# Patient Record
Sex: Male | Born: 1960 | Race: Black or African American | Hispanic: No | Marital: Married | Smoking: Former smoker
Health system: Southern US, Community
[De-identification: ages and names within clinical notes are randomized; demographics above are authoritative.]

## PROBLEM LIST (undated history)

## (undated) DIAGNOSIS — Z789 Other specified health status: Secondary | ICD-10-CM

## (undated) DIAGNOSIS — E119 Type 2 diabetes mellitus without complications: Secondary | ICD-10-CM

## (undated) HISTORY — PX: HERNIA REPAIR: SHX51

## (undated) HISTORY — PX: NO PAST SURGERIES: SHX2092

---

## 2004-11-05 ENCOUNTER — Ambulatory Visit: Payer: Self-pay | Admitting: Family Medicine

## 2004-11-06 ENCOUNTER — Ambulatory Visit (HOSPITAL_COMMUNITY): Admission: RE | Admit: 2004-11-06 | Discharge: 2004-11-06 | Payer: Self-pay | Admitting: Family Medicine

## 2004-11-19 ENCOUNTER — Ambulatory Visit: Payer: Self-pay | Admitting: Family Medicine

## 2004-11-21 ENCOUNTER — Ambulatory Visit (HOSPITAL_COMMUNITY): Admission: RE | Admit: 2004-11-21 | Discharge: 2004-11-21 | Payer: Self-pay | Admitting: Family Medicine

## 2007-12-27 ENCOUNTER — Emergency Department (HOSPITAL_COMMUNITY): Admission: EM | Admit: 2007-12-27 | Discharge: 2007-12-27 | Payer: Self-pay | Admitting: Emergency Medicine

## 2012-01-18 ENCOUNTER — Encounter (HOSPITAL_COMMUNITY): Payer: Self-pay | Admitting: *Deleted

## 2012-01-18 ENCOUNTER — Emergency Department (HOSPITAL_COMMUNITY)
Admission: EM | Admit: 2012-01-18 | Discharge: 2012-01-18 | Disposition: A | Discharge status: Home / Self Care | Payer: Self-pay | Attending: Emergency Medicine | Admitting: Emergency Medicine

## 2012-01-18 DIAGNOSIS — R21 Rash and other nonspecific skin eruption: Secondary | ICD-10-CM | POA: Insufficient documentation

## 2012-01-18 DIAGNOSIS — L259 Unspecified contact dermatitis, unspecified cause: Secondary | ICD-10-CM

## 2012-01-18 DIAGNOSIS — F17200 Nicotine dependence, unspecified, uncomplicated: Secondary | ICD-10-CM | POA: Insufficient documentation

## 2012-01-18 MED ORDER — DEXAMETHASONE SODIUM PHOSPHATE 10 MG/ML IJ SOLN
10.0000 mg | Freq: Once | INTRAMUSCULAR | Status: AC
  Administered 2012-01-18: 10 mg via INTRAMUSCULAR
  Filled 2012-01-18: qty 1

## 2012-01-18 MED ORDER — PREDNISONE 50 MG PO TABS: 50.0000 mg | ORAL_TABLET | Freq: Every day | ORAL | Status: AC

## 2012-01-18 NOTE — ED Provider Notes (Signed)
History     CSN: [id number]  Arrival date & time 01/18/12  7829   First MD Initiated Contact with Patient 01/18/12 1010      Chief Complaint  Patient presents with  . Rash    (Consider location/radiation/quality/duration/timing/severity/associated sxs/prior treatment) HPI Patient presents with four-day history of a rash to the upper arms, neck and hands.  Patient, states he did use a new moisturizing lotion and that that may be the cause.  Patient denies chest pain, shortness breath, wheezing, difficulty breathing, throat swelling, nausea, vomiting, or diarrhea.  Patient, states he did not try name treatment prior to arrival, for his symptoms. History reviewed. No pertinent past medical history.  History reviewed. No pertinent past surgical history.  History reviewed. No pertinent family history.  History  Substance Use Topics  . Smoking status: Current Everyday Smoker  . Smokeless tobacco: Not on file  . Alcohol Use: No      Review of Systems All other systems negative except as documented in the HPI. All pertinent positives and negatives as reviewed in the HPI.  Allergies  Review of patient's allergies indicates no known allergies.  Home Medications   Current Outpatient Rx  Name Route Sig Dispense Refill  . ADULT MULTIVITAMIN W/MINERALS CH Oral Take 1 tablet by mouth daily. ALIVE Chewable 50 plus      BP 141/93  Pulse 70  Temp 98 F (36.7 C) (Oral)  Resp 17  SpO2 100%  Physical Exam  Nursing note and vitals reviewed. Constitutional: He appears well-developed and well-nourished.  Cardiovascular: Normal rate, regular rhythm and normal heart sounds.  Exam reveals no gallop and no friction rub.   No murmur heard. Pulmonary/Chest: Effort normal and breath sounds normal. No respiratory distress.  Skin: Skin is warm and dry. Rash noted. Rash is maculopapular.       ED Course  Procedures (including critical care time)   The patient will be treated for  contact type reaction. Told to return here as needed. Benadryl for itching MDM          TAYDON NASWORTHY, PA-C 01/18/12 1114

## 2012-01-18 NOTE — ED Notes (Signed)
C/o rash & itchiness x 5 days. Rash noted to both axillary areas, bilateral forearms, neck, lower back & perineal area. No drainage. Reports has had it before in past. Denies any new meds, soap, detergent.

## 2012-01-18 NOTE — ED Notes (Signed)
Pt reports rash to abdomen, back, arms, and privates x 4 days. Pt reports he also has blister to left foot. Airway intact. Pt has been using alcohol to skin to help relieve itching. Small macular rash noted.

## 2012-01-19 NOTE — ED Provider Notes (Signed)
Medical screening examination/treatment/procedure(s) were conducted as a shared visit with non-physician practitioner(s) and myself.  I personally evaluated the patient during the encounter  Toy Baker, MD 01/19/12 0830

## 2012-02-23 ENCOUNTER — Emergency Department (HOSPITAL_COMMUNITY)
Admission: EM | Admit: 2012-02-23 | Discharge: 2012-02-23 | Disposition: A | Discharge status: Home / Self Care | Payer: Self-pay | Attending: Emergency Medicine | Admitting: Emergency Medicine

## 2012-02-23 ENCOUNTER — Encounter (HOSPITAL_COMMUNITY): Payer: Self-pay

## 2012-02-23 DIAGNOSIS — M7989 Other specified soft tissue disorders: Secondary | ICD-10-CM

## 2012-02-23 DIAGNOSIS — M79605 Pain in left leg: Secondary | ICD-10-CM

## 2012-02-23 DIAGNOSIS — L02419 Cutaneous abscess of limb, unspecified: Secondary | ICD-10-CM | POA: Insufficient documentation

## 2012-02-23 DIAGNOSIS — M79609 Pain in unspecified limb: Secondary | ICD-10-CM

## 2012-02-23 DIAGNOSIS — M79672 Pain in left foot: Secondary | ICD-10-CM

## 2012-02-23 DIAGNOSIS — L0390 Cellulitis, unspecified: Secondary | ICD-10-CM

## 2012-02-23 DIAGNOSIS — F17200 Nicotine dependence, unspecified, uncomplicated: Secondary | ICD-10-CM | POA: Insufficient documentation

## 2012-02-23 LAB — CBC WITH DIFFERENTIAL/PLATELET
Basophils Absolute: 0 10*3/uL (ref 0.0–0.1)
Hemoglobin: 13.9 g/dL (ref 13.0–17.0)
MCH: 32 pg (ref 26.0–34.0)
MCHC: 34.5 g/dL (ref 30.0–36.0)
MCV: 92.9 fL (ref 78.0–100.0)
Platelets: 217 10*3/uL (ref 150–400)
RBC: 4.34 MIL/uL (ref 4.22–5.81)

## 2012-02-23 LAB — BASIC METABOLIC PANEL
Calcium: 9.4 mg/dL (ref 8.4–10.5)
Creatinine, Ser: 0.98 mg/dL (ref 0.50–1.35)
GFR calc Af Amer: >90 mL/min (ref >90)
Glucose, Bld: 130 mg/dL — ABNORMAL HIGH (ref 70–99)
Sodium: 136 mEq/L (ref 135–145)

## 2012-02-23 MED ORDER — OXYCODONE-ACETAMINOPHEN 5-325 MG PO TABS
2.0000 | ORAL_TABLET | Freq: Once | ORAL | Status: AC
  Administered 2012-02-23: 2 via ORAL
  Filled 2012-02-23: qty 2

## 2012-02-23 MED ORDER — OXYCODONE-ACETAMINOPHEN 5-325 MG PO TABS: 1.0000 | ORAL_TABLET | Freq: Four times a day (QID) | ORAL | Status: DC | PRN

## 2012-02-23 MED ORDER — CEPHALEXIN 500 MG PO CAPS: 500.0000 mg | ORAL_CAPSULE | Freq: Four times a day (QID) | ORAL | Status: DC

## 2012-02-23 MED ORDER — CEFAZOLIN SODIUM 1-5 GM-% IV SOLN
1.0000 g | Freq: Once | INTRAVENOUS | Status: AC
  Administered 2012-02-23: 1 g via INTRAVENOUS
  Filled 2012-02-23: qty 50

## 2012-02-23 NOTE — ED Provider Notes (Signed)
History     CSN: [id number]  Arrival date & time 02/23/12  1017   First MD Initiated Contact with Patient 02/23/12 1036      Chief Complaint  Patient presents with  . Leg Pain    left side  . Leg Swelling    left side    (Consider location/radiation/quality/duration/timing/severity/associated sxs/prior treatment) HPI Comments: Frank Landry 51 y.o. male   The chief complaint is: Patient presents with:   Leg Pain - left side   Leg Swelling - left side   The patient has medical history significant for:   History reviewed. No pertinent past medical history.  Patient presents with left leg swelling and foot pain x 2 weeks. He states that the pain 10/10 and has affected his ability to ambulate. Patient states he has noticed that the foot has been weeping and is treating it with hydrogen peroxide. He denies recent long travel. History of blood clot, hypercoaguable state, cancer, or trauma to this leg. The patient has not seen a physician in many years so current medical problems if any are unknown. Denies fever or chills but reports diaphoresis. Denies CP or SOB. Denies NVD or abdominal pain.      The history is provided by the patient.    History reviewed. No pertinent past medical history.  Past Surgical History  Procedure Date  . No past surgeries     No family history on file.  History  Substance Use Topics  . Smoking status: Current Every Day Smoker  . Smokeless tobacco: Not on file  . Alcohol Use: Yes     occasionally      Review of Systems  Constitutional: Negative for fever and diaphoresis.  Respiratory: Negative for shortness of breath.   Cardiovascular: Positive for leg swelling. Negative for chest pain and palpitations.  Gastrointestinal: Negative for nausea, vomiting, abdominal pain and diarrhea.  Skin: Positive for color change.  All other systems reviewed and are negative.    Allergies  Review of patient's allergies indicates no  known allergies.  Home Medications   Current Outpatient Rx  Name Route Sig Dispense Refill  . ADULT MULTIVITAMIN W/MINERALS CH Oral Take 1 tablet by mouth daily. ALIVE Chewable 50 plus      BP 137/89  Pulse 90  Temp 98.2 F (36.8 C) (Oral)  Resp 16  SpO2 96%  Physical Exam  Nursing note and vitals reviewed. Constitutional: He appears well-developed and well-nourished. No distress.  HENT:  Head: Normocephalic and atraumatic.  Mouth/Throat: Oropharynx is clear and moist.  Eyes: Conjunctivae normal and EOM are normal. No scleral icterus.  Neck: Normal range of motion. Neck supple.  Cardiovascular: Normal rate, regular rhythm and normal heart sounds.   Pulmonary/Chest: Effort normal and breath sounds normal.  Abdominal: Soft. Bowel sounds are normal. There is no tenderness.  Musculoskeletal: Normal range of motion. He exhibits edema and tenderness.       Tenderness and non pitting edema of the left foot and leg. Increased warmth and hyperpigmentation when compared to right leg suspicicous for venous stasis with concomitant cellulitis.  Neurological: He is alert.  Skin: Skin is warm and dry.    ED Course  Procedures (including critical care time)  Labs Reviewed - No data to display No results found. Results for orders placed during the hospital encounter of 02/23/12  CBC WITH DIFFERENTIAL      Component Value Range   WBC 8.4  4.0 - 10.5 K/uL   RBC 4.34  4.22 - 5.81 MIL/uL   Hemoglobin 13.9  13.0 - 17.0 g/dL   HCT 16.1  09.6 - 04.5 %   MCV 92.9  78.0 - 100.0 fL   MCH 32.0  26.0 - 34.0 pg   MCHC 34.5  30.0 - 36.0 g/dL   RDW 40.9  81.1 - 91.4 %   Platelets 217  150 - 400 K/uL   Neutrophils Relative 63  43 - 77 %   Neutro Abs 5.3  1.7 - 7.7 K/uL   Lymphocytes Relative 26  12 - 46 %   Lymphs Abs 2.2  0.7 - 4.0 K/uL   Monocytes Relative 9  3 - 12 %   Monocytes Absolute 0.7  0.1 - 1.0 K/uL   Eosinophils Relative 3  0 - 5 %   Eosinophils Absolute 0.2  0.0 - 0.7 K/uL    Basophils Relative 0  0 - 1 %   Basophils Absolute 0.0  0.0 - 0.1 K/uL  BASIC METABOLIC PANEL      Component Value Range   Sodium 136  135 - 145 mEq/L   Potassium 4.1  3.5 - 5.1 mEq/L   Chloride 103  96 - 112 mEq/L   CO2 24  19 - 32 mEq/L   Glucose, Bld 130 (*) 70 - 99 mg/dL   BUN 12  6 - 23 mg/dL   Creatinine, Ser 7.82  0.50 - 1.35 mg/dL   Calcium 9.4  8.4 - 95.6 mg/dL   GFR calc non Af Amer >90  >90 mL/min   GFR calc Af Amer >90  >90 mL/min    Vascular Duplex: Left: No evidence of DVT, superficial thrombosis, or Baker's cyst. Right: Negative for DVT in the common femoral vein.   1. Left leg pain   2. Left foot pain   3. Cellulitis       MDM  Patient presented with left leg swelling and left foot pain X 2 weeks. Pain medication given in ED with improvement. CBC, BMP: unremarkable Vascular venous suplex: unremarkable. Patient discharged on ABX  and pain medication, with recommendation to return in 2 days to Urgent Care or ED for recheck of cellulitic foot. Return precautions given verbally and in discharge summary. No red flags for DVT, compartment syndrome, or septic arthritis.        Pixie Casino, PA-C 02/23/12 1324

## 2012-02-23 NOTE — Progress Notes (Signed)
Noted pt is self pay, no pcp [place] resident.  CM provided information on discounted pharmacies, financial resources, self pay providers and medication resources. Pt voiced understanding and appreciation of resources and services offered Pt reports working at [employer] part time without coverage, staying with girlfriend and family hx of dm (mother) Also seen by [facility] also

## 2012-02-23 NOTE — Progress Notes (Signed)
Left:  No evidence of DVT, superficial thrombosis, or Baker's cyst.  Right:  Negative for DVT in the common femoral vein.  

## 2012-02-23 NOTE — ED Notes (Signed)
C/o left leg pain x 2 days with swelling. Reports waking up 2 days ago and notice the leg to be swollen. non-pitting edema noted on left foot with pain of 10/10.

## 2012-02-25 ENCOUNTER — Encounter (HOSPITAL_COMMUNITY): Payer: Self-pay

## 2012-02-25 ENCOUNTER — Inpatient Hospital Stay (HOSPITAL_COMMUNITY)
Admission: EM | Admit: 2012-02-25 | Discharge: 2012-02-27 | DRG: 603 | Disposition: A | Discharge status: Home / Self Care | Payer: MEDICAID | Attending: Internal Medicine | Admitting: Internal Medicine

## 2012-02-25 ENCOUNTER — Emergency Department (HOSPITAL_COMMUNITY): Payer: Self-pay

## 2012-02-25 DIAGNOSIS — L02619 Cutaneous abscess of unspecified foot: Principal | ICD-10-CM | POA: Diagnosis present

## 2012-02-25 DIAGNOSIS — Z792 Long term (current) use of antibiotics: Secondary | ICD-10-CM

## 2012-02-25 DIAGNOSIS — F17200 Nicotine dependence, unspecified, uncomplicated: Secondary | ICD-10-CM | POA: Diagnosis present

## 2012-02-25 DIAGNOSIS — L03119 Cellulitis of unspecified part of limb: Principal | ICD-10-CM

## 2012-02-25 DIAGNOSIS — T50905A Adverse effect of unspecified drugs, medicaments and biological substances, initial encounter: Secondary | ICD-10-CM | POA: Diagnosis not present

## 2012-02-25 DIAGNOSIS — M79672 Pain in left foot: Secondary | ICD-10-CM

## 2012-02-25 DIAGNOSIS — E871 Hypo-osmolality and hyponatremia: Secondary | ICD-10-CM

## 2012-02-25 DIAGNOSIS — T360X5A Adverse effect of penicillins, initial encounter: Secondary | ICD-10-CM | POA: Diagnosis not present

## 2012-02-25 DIAGNOSIS — F1210 Cannabis abuse, uncomplicated: Secondary | ICD-10-CM | POA: Diagnosis present

## 2012-02-25 DIAGNOSIS — L0390 Cellulitis, unspecified: Secondary | ICD-10-CM

## 2012-02-25 DIAGNOSIS — IMO0002 Reserved for concepts with insufficient information to code with codable children: Secondary | ICD-10-CM

## 2012-02-25 DIAGNOSIS — L03116 Cellulitis of left lower limb: Secondary | ICD-10-CM

## 2012-02-25 DIAGNOSIS — R7301 Impaired fasting glucose: Secondary | ICD-10-CM

## 2012-02-25 DIAGNOSIS — L270 Generalized skin eruption due to drugs and medicaments taken internally: Secondary | ICD-10-CM | POA: Diagnosis not present

## 2012-02-25 HISTORY — DX: Other specified health status: Z78.9

## 2012-02-25 LAB — CBC WITH DIFFERENTIAL/PLATELET
Basophils Absolute: 0 10*3/uL (ref 0.0–0.1)
Basophils Relative: 0 % (ref 0–1)
Eosinophils Relative: 3 % (ref 0–5)
Lymphocytes Relative: 27 % (ref 12–46)
Lymphs Abs: 2 10*3/uL (ref 0.7–4.0)
MCH: 32.1 pg (ref 26.0–34.0)
MCHC: 34.8 g/dL (ref 30.0–36.0)
MCV: 92.3 fL (ref 78.0–100.0)
Neutrophils Relative %: 62 % (ref 43–77)
Platelets: 235 10*3/uL (ref 150–400)
RBC: 4.55 MIL/uL (ref 4.22–5.81)

## 2012-02-25 LAB — BASIC METABOLIC PANEL
BUN: 11 mg/dL (ref 6–23)
CO2: 25 mEq/L (ref 19–32)
Sodium: 134 mEq/L — ABNORMAL LOW (ref 135–145)

## 2012-02-25 MED ORDER — VANCOMYCIN HCL IN DEXTROSE 1-5 GM/200ML-% IV SOLN
1000.0000 mg | Freq: Once | INTRAVENOUS | Status: AC
  Administered 2012-02-25: 1000 mg via INTRAVENOUS
  Filled 2012-02-25: qty 200

## 2012-02-25 MED ORDER — ONDANSETRON HCL 4 MG PO TABS: 4.0000 mg | ORAL_TABLET | Freq: Four times a day (QID) | ORAL | Status: DC | PRN

## 2012-02-25 MED ORDER — ONDANSETRON HCL 4 MG/2ML IJ SOLN: 4.0000 mg | Freq: Four times a day (QID) | INTRAMUSCULAR | Status: DC | PRN

## 2012-02-25 MED ORDER — ZOLPIDEM TARTRATE 5 MG PO TABS: 5.0000 mg | ORAL_TABLET | Freq: Every evening | ORAL | Status: DC | PRN

## 2012-02-25 MED ORDER — PIPERACILLIN-TAZOBACTAM 3.375 G IVPB
3.3750 g | Freq: Once | INTRAVENOUS | Status: AC
  Administered 2012-02-25: 3.375 g via INTRAVENOUS
  Filled 2012-02-25: qty 50

## 2012-02-25 MED ORDER — HYDROMORPHONE HCL PF 1 MG/ML IJ SOLN: 0.5000 mg | INTRAMUSCULAR | Status: DC | PRN

## 2012-02-25 MED ORDER — IOHEXOL 300 MG/ML  SOLN
100.0000 mL | Freq: Once | INTRAMUSCULAR | Status: AC | PRN
  Administered 2012-02-25: 100 mL via INTRAVENOUS

## 2012-02-25 MED ORDER — ENOXAPARIN SODIUM 40 MG/0.4ML ~~LOC~~ SOLN
40.0000 mg | SUBCUTANEOUS | Status: DC
  Administered 2012-02-25 – 2012-02-26 (×2): 40 mg via SUBCUTANEOUS
  Filled 2012-02-25 (×3): qty 0.4

## 2012-02-25 MED ORDER — ALUM & MAG HYDROXIDE-SIMETH 200-200-20 MG/5ML PO SUSP: 30.0000 mL | Freq: Four times a day (QID) | ORAL | Status: DC | PRN

## 2012-02-25 MED ORDER — VANCOMYCIN HCL IN DEXTROSE 1-5 GM/200ML-% IV SOLN
1000.0000 mg | Freq: Two times a day (BID) | INTRAVENOUS | Status: DC
  Administered 2012-02-25 – 2012-02-27 (×4): 1000 mg via INTRAVENOUS
  Filled 2012-02-25 (×4): qty 200

## 2012-02-25 MED ORDER — OXYCODONE-ACETAMINOPHEN 5-325 MG PO TABS: 1.0000 | ORAL_TABLET | Freq: Four times a day (QID) | ORAL | Status: DC | PRN

## 2012-02-25 MED ORDER — TERBINAFINE HCL 1 % EX CREA
TOPICAL_CREAM | Freq: Two times a day (BID) | CUTANEOUS | Status: DC
  Administered 2012-02-25: 1 via TOPICAL
  Administered 2012-02-26 – 2012-02-27 (×3): via TOPICAL
  Filled 2012-02-25: qty 12

## 2012-02-25 MED ORDER — ACETAMINOPHEN 325 MG PO TABS: 650.0000 mg | ORAL_TABLET | Freq: Four times a day (QID) | ORAL | Status: DC | PRN

## 2012-02-25 MED ORDER — SODIUM CHLORIDE 0.9 % IV SOLN
Freq: Once | INTRAVENOUS | Status: AC
  Administered 2012-02-25: 20 mL/h via INTRAVENOUS

## 2012-02-25 MED ORDER — HYDROMORPHONE HCL PF 1 MG/ML IJ SOLN
1.0000 mg | Freq: Once | INTRAMUSCULAR | Status: AC
  Administered 2012-02-25: 1 mg via INTRAVENOUS
  Filled 2012-02-25: qty 1

## 2012-02-25 MED ORDER — PIPERACILLIN-TAZOBACTAM 3.375 G IVPB
3.3750 g | Freq: Three times a day (TID) | INTRAVENOUS | Status: DC
  Administered 2012-02-25 – 2012-02-26 (×2): 3.375 g via INTRAVENOUS
  Filled 2012-02-25 (×3): qty 50

## 2012-02-25 MED ORDER — ACETAMINOPHEN 650 MG RE SUPP: 650.0000 mg | Freq: Four times a day (QID) | RECTAL | Status: DC | PRN

## 2012-02-25 NOTE — Progress Notes (Signed)
Initial review is complete. 

## 2012-02-25 NOTE — ED Notes (Signed)
Patient states he was seen 3 days ago for left leg swelling and redness. Patient states he was told to come back today for a recheck. Leg remains swollen and red. Patient rates pain 10/10. Patient states he is compliant with antibiotics.

## 2012-02-25 NOTE — ED Provider Notes (Signed)
Medical screening examination/treatment/procedure(s) were conducted as a shared visit with non-physician practitioner(s) and myself.  I personally evaluated the patient during the encounter.  Evaluated pt's foot.  Not soft tissue swelling and erythema consistent with cellulitis.  CT scan demonstrates no evidence of an abscess.  Broad abx administered.  Discussed his evaluation with the on-call PA covering the hospitalist group.  Plan admit for parenteral abx to treat cellulitis and failure of outpt mgmnt.  Tobin Chad, MD 02/25/12 1114

## 2012-02-25 NOTE — ED Notes (Signed)
Pt to CT  Pt alert and oriented x4. Respirations even and unlabored, bilateral symmetrical rise and fall of chest. Skin warm and dry. In no acute distress. Denies needs.   

## 2012-02-25 NOTE — ED Provider Notes (Signed)
Medical screening examination/treatment/procedure(s) were conducted as a shared visit with non-physician practitioner(s) and myself.  I personally evaluated the patient during the encounter Pt c/o left lower leg swelling, redness, warmth, soreness for 2 weeks. No injury. No hx dm. No hx dvt. Dopplers neg for [email or web address] distal pulses. Compartments soft, not tense. Mild sts. No pain w passive rom at knee or ankle. Exam c/w cellulitis.   Suzi Roots, MD 02/25/12 [phone]

## 2012-02-25 NOTE — H&P (Signed)
Mr. Stan was seen and examined.  Admitted with LLE cellulitis.  CT negative for deeper abscess.  I agree with the assessment and plan outlined by Aurelio Brash, NP.  Add anti-fungal ointment.  RAMA,CHRISTINA 02/25/2012 6:00 PM

## 2012-02-25 NOTE — H&P (Signed)
[facility] History and Physical  WYLDER MACOMBER KGM:[id number] DOB: Nov 24, 1960 DOA: 02/25/2012  Referring physician: Lorenso Courier PCP: No primary provider on file. NONE Has not been to doctor in decades  Chief Complaint: swelling/pain left foot/ankle  HPI: Cristen Chancelor Hardrick is a very pleasant  51 y.o. male with no medical history who presents to ED cc left foot/ankle pain/swelling. Pt was seen in ED 2 days ago for same and give antibiotics and pain medicine. He was instructed at that time to return if no improvement or worsening. Pt reports pain increased as well as the redness and swelling. He denies any recent injury/trauma to area. Denies fall. He reports that about 2 weeks ago he was in back yard hanging clothes on clothes line and he "bumped" his ankle on something in the yard and it was shortly thereafter that he began to feel discomfort in left foot. States the pain increased gradually and he developed swelling and redness over the following several days and when walking became difficult he came to ED 2 days ago. Denies fever, chills, nausea/vomiting/diarrhea. Denies headache, dizziness. Symptoms came on gradually and have worsened in spite of antibiotics (for 2 days) and pain med. Walking makes symptoms worse. Rates pain 6-7/10 at worst. In ED CT of left foot yields soft tissue swelling along the left ankle tracking in the  dorsum of the left foot. This could certainly represent cellulitis. No drainable abscess or bony destructive findings typical for osteomyelitis identified.  Scattered spurring in the foot. He was started on IV Vanc and Zosyn in ED. [facility] asked to admit.     Review of Systems: The patient denies anorexia, fever, weight loss,, vision loss, decreased hearing, hoarseness, chest pain, syncope, dyspnea on exertion, peripheral edema, balance deficits, hemoptysis, abdominal pain, melena, hematochezia, severe indigestion/heartburn, hematuria, incontinence, genital  sores, muscle weakness,  transient blindness, depression, unusual weight change, abnormal bleeding, enlarged lymph nodes, angioedema, and breast masses.    Past Medical History  Diagnosis Date  . No pertinent past medical history    Past Surgical History  Procedure Date  . No past surgeries   . Hernia repair    Social History:  reports that he has been smoking.  He has never used smokeless tobacco. He reports that he drinks alcohol. He reports that he uses illicit drugs (Marijuana). Lives with girlfriend. Is employed at [employer]. Independent with ADL's  No Known Allergies  Family History  Problem Relation Age of Onset  . Diabetes Mother   . Cancer Father    5 siblings. No known health issues. One brother treated recently for CP   Prior to Admission medications   Medication Sig Start Date End Date Taking? Authorizing Provider  cephALEXin (KEFLEX) 500 MG capsule Take 1 capsule (500 mg total) by mouth 4 (four) times daily. 02/23/12  Yes Tia Oliveri, PA-C  oxyCODONE-acetaminophen (PERCOCET/ROXICET) 5-325 MG per tablet Take 1 tablet by mouth every 6 (six) hours as needed for pain. 02/23/12  Yes Pixie Casino, PA-C   Physical Exam: Filed Vitals:   02/25/12 0730 02/25/12 0801 02/25/12 0902  BP: 135/80  121/71  Pulse: 99  75  Temp: 98.8 F (37.1 C)    TempSrc: Oral    Resp: 18  16  Weight:  106.323 kg (234 lb 6.4 oz)   SpO2: 97%  99%     General:  Awake, alert well nourished NAD  Eyes: PERRL EOMI  ENT: mucus membranes mouth pink/moist. Ears clear. No drainage from nose  Neck: supple full rom no JVD  Cardiovascular: RRR No MGR   Respiratory: normal effort BSCTAB no wheeze,rhonchi  Abdomen: round, soft +BS non-tender to palp. No mass/organmegaly  Skin: warm dry/no rash/lesion  Musculoskeletal: MOE left foot/ankle with erythema up to mid calf. Left foot/ankle with swelling particularly on backside of foot. Warm to touch, and mildly painful to touch. Decreased rom due to  pain  Psychiatric: mood appropriate, cooperative  Neurologic: alert, oriented x3. Cranial nerve II-XII intact. Speech clear  Labs on Admission:  Basic Metabolic Panel:  Lab 02/25/12 4540 02/23/12 1110  NA 134* 136  K 4.8 4.1  CL 99 103  CO2 25 24  GLUCOSE 121* 130*  BUN 11 12  CREATININE 1.06 0.98  CALCIUM 9.8 9.4  MG -- --  PHOS -- --   Liver Function Tests: No results found for this basename: AST:5,ALT:5,ALKPHOS:5,BILITOT:5,PROT:5,ALBUMIN:5 in the last 168 hours No results found for this basename: LIPASE:5,AMYLASE:5 in the last 168 hours No results found for this basename: AMMONIA:5 in the last 168 hours CBC:  Lab 02/25/12 0825 02/23/12 1110  WBC 7.4 8.4  NEUTROABS 4.6 5.3  HGB 14.6 13.9  HCT 42.0 40.3  MCV 92.3 92.9  PLT 235 217   Cardiac Enzymes: No results found for this basename: CKTOTAL:5,CKMB:5,CKMBINDEX:5,TROPONINI:5 in the last 168 hours  BNP (last 3 results) No results found for this basename: PROBNP:3 in the last 8760 hours CBG: No results found for this basename: GLUCAP:5 in the last 168 hours  Radiological Exams on Admission: Ct Foot Left W Contrast  02/25/2012  *RADIOLOGY REPORT*  Clinical Data: Left foot edema and erythema.  CT OF THE LEFT FOOT WITH CONTRAST  Technique:  Multidetector CT imaging was performed following the standard protocol during bolus administration of intravenous contrast.  Contrast: OMNIPAQUE IOHEXOL 300 MG/ML  SOLN  Comparison: None  Findings: Dorsal subcutaneous edema noted in the left foot laterally.  No underlying bony destructive findings to suggest CT evidence of osteomyelitis.  The subcutaneous edema tracks up along the lateral ankle.  No drainable abscess is observed.  A moderate degree of subcutaneous edema tracks along the medial ankle.  Small fragmented osteophytes are noted along the deep tibiotalar component of the deltoid ligament bilaterally.  Mild spurring along the Chopart joint noted bilaterally.  Plantar and  Achilles calcaneal spurs noted.  IMPRESSION: 1.  Soft tissue swelling along the left ankle tracking in the dorsum of the left foot.  This could certainly represent cellulitis.  No drainable abscess or bony destructive findings typical for osteomyelitis identified.  2.  Scattered spurring in the foot.   Original Report Authenticated By: Dellia Cloud, M.D.     EKG: Independently reviewed.   Assessment/Plan Principal Problem:  *Cellulitis: will admit to medical bed having failed OP treatment. Will continue Vanc/Zosyn per pharmacy. Will provide pain med as needed. Will elevate left foot as well. Pt currently afebrile and non-toxic appearing. Will monitor closely.   Hyponatremia (very mild). Received IV fluids in ED. Taking po nourishment without issue. Will recheck in am.   Code Status: full Family Communication: Pt and significant other at bedside Disposition Plan: home when medically stable hopefully 24-48 hrs  Time spent: 40 minutes  Gwenyth Bender NP [facility] Pager [phone]  If 7PM-7AM, please contact night-coverage [email or web address] Password [facility] 02/25/2012, 11:21 AM

## 2012-02-25 NOTE — ED Provider Notes (Signed)
History     CSN: [id number]  Arrival date & time 02/25/12  0719   First MD Initiated Contact with Patient 02/25/12 [phone]      Chief Complaint  Patient presents with  . Leg Swelling    (Consider location/radiation/quality/duration/timing/severity/associated sxs/prior treatment) HPI Comments: Frank Landry 51 y.o. male   The chief complaint is: Patient presents with:   Leg Swelling   The patient has medical history significant for:   History reviewed. No pertinent past medical history.  Patient presents to ER for revaluation of his cellulitic left foot. Patient was seen by myself and Dr. [name] on 02/23/12 with complaints of left leg swelling and foot pain. On that visit we vascular doppler imaging was negative. Patient was discharged on Keflex and told to return in 2 days. Patient states he has been adherent to his ABX. Today the foot is still markedly swollen, with limited range of motion, discolored and hot to touch. Denies fever or chills. Denies NVD or abdominal pain. Denies CP, palpitations or SOB.         The history is provided by the patient. No language interpreter was used.    History reviewed. No pertinent past medical history.  Past Surgical History  Procedure Date  . No past surgeries   . Hernia repair     Family History  Problem Relation Age of Onset  . Diabetes Mother   . Cancer Father     History  Substance Use Topics  . Smoking status: Current Every Day Smoker  . Smokeless tobacco: Not on file  . Alcohol Use: Yes     occasionally      Review of Systems  Constitutional: Negative for fever and chills.  Respiratory: Negative for cough.   Cardiovascular: Negative for chest pain and leg swelling.  Gastrointestinal: Negative for nausea, vomiting, abdominal pain and diarrhea.  Musculoskeletal: Positive for arthralgias.  Skin: Positive for color change.  All other systems reviewed and are negative.    Allergies  Review of  patient's allergies indicates no known allergies.  Home Medications   Current Outpatient Rx  Name Route Sig Dispense Refill  . CEPHALEXIN 500 MG PO CAPS Oral Take 1 capsule (500 mg total) by mouth 4 (four) times daily. 28 capsule 0  . OXYCODONE-ACETAMINOPHEN 5-325 MG PO TABS Oral Take 1 tablet by mouth every 6 (six) hours as needed for pain. 6 tablet 0    BP 135/80  Pulse 99  Temp 98.8 F (37.1 C) (Oral)  Resp 18  Wt 234 lb 6.4 oz (106.323 kg)  SpO2 97%  Physical Exam  Nursing note and vitals reviewed. Constitutional: He appears well-developed and well-nourished. No distress.  HENT:  Head: Normocephalic and atraumatic.  Eyes: Conjunctivae normal and EOM are normal. No scleral icterus.  Neck: Neck supple.  Cardiovascular: Normal rate, regular rhythm and normal heart sounds.   Pulmonary/Chest: Effort normal and breath sounds normal.  Abdominal: Soft. Bowel sounds are normal. There is no tenderness.  Musculoskeletal: He exhibits edema and tenderness.       Left foot with hot, erythematous, with pronounced edema and tenderness to palpation.  Good cap refill.  Toenails appear to have dermatophyte infection, most are green appearing.  Patient has limited ROM with dorsiflexion, extension, inversion and eversion.  Neurological: He is alert.  Skin: Skin is warm and dry.    ED Course  Procedures (including critical care time)   Labs Reviewed  CBC WITH DIFFERENTIAL  BASIC METABOLIC PANEL  Results for orders placed during the hospital encounter of 02/25/12  CBC WITH DIFFERENTIAL      Component Value Range   WBC 7.4  4.0 - 10.5 K/uL   RBC 4.55  4.22 - 5.81 MIL/uL   Hemoglobin 14.6  13.0 - 17.0 g/dL   HCT 16.1  09.6 - 04.5 %   MCV 92.3  78.0 - 100.0 fL   MCH 32.1  26.0 - 34.0 pg   MCHC 34.8  30.0 - 36.0 g/dL   RDW 40.9  81.1 - 91.4 %   Platelets 235  150 - 400 K/uL   Neutrophils Relative 62  43 - 77 %   Neutro Abs 4.6  1.7 - 7.7 K/uL   Lymphocytes Relative 27  12 - 46  %   Lymphs Abs 2.0  0.7 - 4.0 K/uL   Monocytes Relative 7  3 - 12 %   Monocytes Absolute 0.5  0.1 - 1.0 K/uL   Eosinophils Relative 3  0 - 5 %   Eosinophils Absolute 0.2  0.0 - 0.7 K/uL   Basophils Relative 0  0 - 1 %   Basophils Absolute 0.0  0.0 - 0.1 K/uL  BASIC METABOLIC PANEL      Component Value Range   Sodium 134 (*) 135 - 145 mEq/L   Potassium 4.8  3.5 - 5.1 mEq/L   Chloride 99  96 - 112 mEq/L   CO2 25  19 - 32 mEq/L   Glucose, Bld 121 (*) 70 - 99 mg/dL   BUN 11  6 - 23 mg/dL   Creatinine, Ser 7.82  0.50 - 1.35 mg/dL   Calcium 9.8  8.4 - 95.6 mg/dL   GFR calc non Af Amer 78 (*) >90 mL/min   GFR calc Af Amer >90  >90 mL/min    Ct Foot Left W Contrast  02/25/2012  *RADIOLOGY REPORT*  Clinical Data: Left foot edema and erythema.  CT OF THE LEFT FOOT WITH CONTRAST  Technique:  Multidetector CT imaging was performed following the standard protocol during bolus administration of intravenous contrast.  Contrast: OMNIPAQUE IOHEXOL 300 MG/ML  SOLN  Comparison: None  Findings: Dorsal subcutaneous edema noted in the left foot laterally.  No underlying bony destructive findings to suggest CT evidence of osteomyelitis.  The subcutaneous edema tracks up along the lateral ankle.  No drainable abscess is observed.  A moderate degree of subcutaneous edema tracks along the medial ankle.  Small fragmented osteophytes are noted along the deep tibiotalar component of the deltoid ligament bilaterally.  Mild spurring along the Chopart joint noted bilaterally.  Plantar and Achilles calcaneal spurs noted.  IMPRESSION: 1.  Soft tissue swelling along the left ankle tracking in the dorsum of the left foot.  This could certainly represent cellulitis.  No drainable abscess or bony destructive findings typical for osteomyelitis identified.  2.  Scattered spurring in the foot.   Original Report Authenticated By: Dellia Cloud, M.D.      1. Cellulitis of left foot   2. Swelling of joint of left ankle  or foot   3. Foot pain, left       MDM  Patient presented to the ED for reevaluation of his cellulitic left foot. Patient seen by myself and Dr. [name] on 02/23/12 and discharged on Keflex. Foot still shows signs of infection in lieu of ABX compliance. Patient started of Vancomycin and Zosyn. CBC: unremarkable BMP: unremarkable    CT left foot with contrast: : remarkable  for deep subcutaneous edema suspcicous for cellulitis without concomitant osteomyelitis. No red flags for osteomyelitis, septic arthritis, or DVT Patient will be admitted by Dr. Lorenso Courier to the hospitalist service for inpatient treatment.        Pixie Casino, PA-C 02/25/12 1006

## 2012-02-25 NOTE — Progress Notes (Signed)
ANTIBIOTIC CONSULT NOTE - INITIAL  Pharmacy Consult for Vancomycin/Zosyn Indication: Cellulitis  No Known Allergies  Patient Measurements: Weight: 234 lb 6.4 oz (106.323 kg)  Vital Signs: Temp: 98.5 F (36.9 C) (09/25 1204) Temp src: Oral (09/25 1204) BP: 125/72 mmHg (09/25 1204) Pulse Rate: 74  (09/25 1204) Intake/Output from previous day:   Intake/Output from this shift:    Labs:  Basename 02/25/12 0825 02/23/12 1110  WBC 7.4 8.4  HGB 14.6 13.9  PLT 235 217  LABCREA -- --  CREATININE 1.06 0.98   CrCl is unknown because there is no height on file for the current visit. No results found for this basename: VANCOTROUGH:2,VANCOPEAK:2,VANCORANDOM:2,GENTTROUGH:2,GENTPEAK:2,GENTRANDOM:2,TOBRATROUGH:2,TOBRAPEAK:2,TOBRARND:2,AMIKACINPEAK:2,AMIKACINTROU:2,AMIKACIN:2, in the last 72 hours   Microbiology: No results found for this or any previous visit (from the past 720 hour(s)).  Medical History: Past Medical History  Diagnosis Date  . No pertinent past medical history     Medications:  Anti-infectives     Start     Dose/Rate Route Frequency Ordered Stop   02/25/12 2200   vancomycin (VANCOCIN) IVPB 1000 mg/200 mL premix        1,000 mg 200 mL/hr over 60 Minutes Intravenous Every 12 hours 02/25/12 1251     02/25/12 1700   piperacillin-tazobactam (ZOSYN) IVPB 3.375 g        3.375 g 12.5 mL/hr over 240 Minutes Intravenous Every 8 hours 02/25/12 1251     02/25/12 0900   vancomycin (VANCOCIN) IVPB 1000 mg/200 mL premix        1,000 mg 200 mL/hr over 60 Minutes Intravenous  Once 02/25/12 0804 02/25/12 1057   02/25/12 0900   piperacillin-tazobactam (ZOSYN) IVPB 3.375 g        3.375 g 100 mL/hr over 30 Minutes Intravenous  Once 02/25/12 0804 02/25/12 0943         Assessment:  51 yom with LLL cellulitis, previously treated as outpt with Cephalexin.  Admit with worsening symptoms, CT shows no osteomyelitis.  Family hx of DM, serum glucose elevated, suggest HgbA1c  for this patient.  Vancomycin and Zosyn per pharmacy  Goal of Therapy:  Vancomycin trough level 10-15 mcg/ml  Plan:   Vancomycin 1gm q12  Zosyn 3.375 gm q8h-4 hr infusion.  Monitor serum glucose, assess for pre-diabetes.  Otho Bellows PharmD Pager [phone] 02/25/2012,12:52 PM

## 2012-02-26 DIAGNOSIS — R7301 Impaired fasting glucose: Secondary | ICD-10-CM | POA: Diagnosis present

## 2012-02-26 DIAGNOSIS — E871 Hypo-osmolality and hyponatremia: Secondary | ICD-10-CM | POA: Diagnosis present

## 2012-02-26 DIAGNOSIS — T50905A Adverse effect of unspecified drugs, medicaments and biological substances, initial encounter: Secondary | ICD-10-CM | POA: Diagnosis not present

## 2012-02-26 LAB — COMPREHENSIVE METABOLIC PANEL
ALT: 16 U/L (ref 0–53)
Alkaline Phosphatase: 70 U/L (ref 39–117)
CO2: 26 mEq/L (ref 19–32)
Calcium: 9.1 mg/dL (ref 8.4–10.5)
Chloride: 98 mEq/L (ref 96–112)
GFR calc Af Amer: 82 mL/min — ABNORMAL LOW (ref >90)
GFR calc non Af Amer: 71 mL/min — ABNORMAL LOW (ref >90)
Glucose, Bld: 122 mg/dL — ABNORMAL HIGH (ref 70–99)
Potassium: 3.9 mEq/L (ref 3.5–5.1)
Sodium: 133 mEq/L — ABNORMAL LOW (ref 135–145)
Total Bilirubin: 0.4 mg/dL (ref 0.3–1.2)

## 2012-02-26 MED ORDER — DIPHENHYDRAMINE HCL 25 MG PO CAPS: 25.0000 mg | ORAL_CAPSULE | Freq: Four times a day (QID) | ORAL | Status: DC | PRN

## 2012-02-26 MED ORDER — DIPHENHYDRAMINE HCL 50 MG/ML IJ SOLN
25.0000 mg | Freq: Once | INTRAMUSCULAR | Status: AC
  Administered 2012-02-26: 25 mg via INTRAVENOUS
  Filled 2012-02-26: qty 1

## 2012-02-26 MED ORDER — DIPHENHYDRAMINE HCL 25 MG PO CAPS
25.0000 mg | ORAL_CAPSULE | Freq: Four times a day (QID) | ORAL | Status: DC | PRN
  Administered 2012-02-26 – 2012-02-27 (×2): 25 mg via ORAL
  Filled 2012-02-26 (×2): qty 1

## 2012-02-26 NOTE — Care Management Note (Unsigned)
    Page 1 of 1   02/26/2012     4:29:41 PM   CARE MANAGEMENT NOTE 02/26/2012  Patient:  Frank Landry, Frank Landry   Account Number:  [id number]  Date Initiated:  02/26/2012  Documentation initiated by:  Konrad Felix  Subjective/Objective Assessment:   Patient admitted with worsening cellulitis after failing OP tx.     Action/Plan:   Patient to be discharged to home when medically stable but does not have a PCP or insurance.   Anticipated DC Date:  02/28/2012   Anticipated DC Plan:  HOME/SELF CARE  In-house referral  Financial Counselor      DC Planning Services  CM consult  Outpatient Services - Pt will follow up  PCP issues      Choice offered to / List presented to:             Status of service:  In process, will continue to follow Medicare Important Message given?   (If response is "NO", the following Medicare IM given date fields will be blank) Date Medicare IM given:   Date Additional Medicare IM given:    Discharge Disposition:    Per UR Regulation:    If discussed at Long Length of Stay Meetings, dates discussed:    Comments:  02/26/2012  4:00pm  Konrad Felix RN, case manager   [phone] Met with patient and his wife to discuss health plans once discharged. Does not have a PCP or health coverage. He states he works at [employer]. Patient states he has the same list of self-pay providers in the area that I was offering him. He states he is going to contact [employer] to set up eligibility and an appointment. Patient and wife state they can afford medications as they are prescribed and we agreed that I would research his discharge meds for the cheapest route. Patient states he does not have any needs in the home. I have referred the patient to the financial counselor.

## 2012-02-26 NOTE — Plan of Care (Signed)
Problem: Food- and Nutrition-Related Knowledge Deficit (NB-1.1) Goal: Nutrition education Formal process to instruct or train a patient/client in a skill or to impart knowledge to help patients/clients voluntarily manage or modify food choices and eating behavior to maintain or improve health.  Outcome: Completed/Met Date Met:  02/26/12 Met with pt and significant other to discuss pt's diet. Pt reports eating mostly unhealthy PTA, a lot of fried foods, excess portions of meals/snacks, and 1-2 gallons of sweet tea and kool-aid daily. Discussed pt's impaired fasting glucose and how, if left untreated, may develop into diabetes. Discussed small steps pt could make in improving diet. Discussed healthy eating plan including sample meal and beverage options. Encouraged adding exercise into his daily lifestyle - amount per MD. Pt admits that his way of unhealthy eating has been "easy" for him but he does not want to develop diabetes and he wants to live a long time. Teach back method used during education. Pt identified several small dietary goals to work on at discharge. Provided handout of healthy eating information with RD contact information. Significant other interested in improving her dietary habits and exercise as well and stated she would help encourage pt in his healthy lifestyle plans. Expect good compliance.

## 2012-02-26 NOTE — Progress Notes (Addendum)
[facility] PROGRESS NOTE  [name] XLK:[id number] DOB: [birth year] DOA: 02/25/2012 PCP: No primary provider on file. Confirmed by patient.  Brief narrative: Frank Landry is a 51 year old man with no significant PMH who initially presented to the ER on 02/23/12 with cellulitis.  He had negative doppler studies done during that visit, and was discharged home on a course of Keflex with instructions to return if his symptoms worsen.  He re-presented on 02/25/12 with worsening left lower extremity erythema and swelling.  A CT scan was done which did not show any evidence of deep abscess, but he was admitted for outpatient failure of treatment of cellulitis.  He is currently being treated with empiric Vancomycin/Zosyn, but has developed a pruritic rash so we are stopping the Zosyn.  Assessment/Plan: Principal Problem:  *Cellulitis  Admitted and placed on empiric Vancomycin/Zosyn after failing outpatient therapy with Keflex.  No evidence of DVT or deep abscess based on dopplers and CT scan of the LLE.  Zosyn stopped 02/26/12 due to the development of a pruritic rash.  No improvement in appearance of LLE yet, continue IV antibiotics. Active Problems:  Hyponatremia  Mild, unclear etiology.  Impaired fasting glucose  Check hemoglobin A1c, dietician consulted for diet education.  Adverse drug reaction to Zosyn (rash)  D/C Zosyn.  Benadryl PRN pruritis.   Code Status: Full Family Communication: Family updated at bedside. Disposition Plan: Home when stable.   Medical Consultants:  None.  Other Consultants:  Dietician  Procedures:  None.  Antibiotics:  Vancomycin 02/25/12--->  Zosyn 02/25/12--->  HPI/Subjective: Frank Landry is complaining of a pruritic rash on his arms and back.  He denies N/V/D.  No change in LLE discomfort and swelling.  Objective: Filed Vitals:   02/25/12 1301 02/25/12 2123 02/25/12 2130 02/26/12 0606  BP:  116/67  117/71  Pulse:  70 80 81   Temp:  98.6 F (37 C)  98.5 F (36.9 C)  TempSrc:  Oral  Oral  Resp:  16  16  Height: 6\' 1"  (1.854 m)     Weight: 106.3 kg (234 lb 5.6 oz)     SpO2:  100%  97%    Intake/Output Summary (Last 24 hours) at 02/26/12 0825 Last data filed at 02/26/12 0455  Gross per 24 hour  Intake 1716.4 ml  Output    275 ml  Net 1441.4 ml    Exam: Gen:  NAD Cardiovascular:  RRR, No M/R/G Respiratory: Lungs CTAB Gastrointestinal: Abdomen soft, NT/ND with normal active bowel sounds. Extremities: LLE swollen, erythematous Skin: Papular rash to arms and back.  Data Reviewed: Basic Metabolic Panel:  Lab 02/26/12 3664 02/25/12 0825 02/23/12 1110  NA 133* 134* 136  K 3.9 4.8 --  CL 98 99 103  CO2 26 25 24   GLUCOSE 122* 121* 130*  BUN 11 11 12   CREATININE 1.15 1.06 0.98  CALCIUM 9.1 9.8 9.4  MG -- -- --  PHOS -- -- --   GFR Estimated Creatinine Clearance: 95.1 ml/min (by C-G formula based on Cr of 1.15). Liver Function Tests:  Lab 02/26/12 0330  AST 19  ALT 16  ALKPHOS 70  BILITOT 0.4  PROT 6.8  ALBUMIN 3.4*   CBC:  Lab 02/25/12 0825 02/23/12 1110  WBC 7.4 8.4  NEUTROABS 4.6 5.3  HGB 14.6 13.9  HCT 42.0 40.3  MCV 92.3 92.9  PLT 235 217    Studies:  Ct Foot Left W Contrast 02/25/2012  IMPRESSION: 1.  Soft tissue swelling along  the left ankle tracking in the dorsum of the left foot.  This could certainly represent cellulitis.  No drainable abscess or bony destructive findings typical for osteomyelitis identified.  2.  Scattered spurring in the foot.   Original Report Authenticated By: Dellia Cloud, M.D.     Scheduled Meds:    . sodium chloride   Intravenous Once  . diphenhydrAMINE  25 mg Intravenous Once  . diphenhydrAMINE  25 mg Intravenous Once  . enoxaparin (LOVENOX) injection  40 mg Subcutaneous Q24H  .  HYDROmorphone (DILAUDID) injection  1 mg Intravenous Once  . piperacillin-tazobactam (ZOSYN)  IV  3.375 g Intravenous Once  . terbinafine   Topical BID    . vancomycin  1,000 mg Intravenous Once  . vancomycin  1,000 mg Intravenous Q12H  . DISCONTD: piperacillin-tazobactam (ZOSYN)  IV  3.375 g Intravenous Q8H   Continuous Infusions:   Time spent: 25 minutes.   LOS: 1 day   RAMA,CHRISTINA  [facility] Pager [phone].  If 8PM-8AM, please contact night-coverage at [email or web address], password [facility] 02/26/2012, 8:25 AM

## 2012-02-26 NOTE — Progress Notes (Signed)
9604 Patient c/o a rash and itching on left arm and back. Patient with a rash on lower back and left arm. On IV antibiotics with zosyn and vancomycin. Cris.Burner Paged NP on call orders received for IV benadryl. 0540-patient with rash on arms, abdomen, back, one area on right knee. Itching "some" but no other complaints. Given benadryl.

## 2012-02-26 NOTE — Progress Notes (Addendum)
Rash resolving in some areas and patient denies itching.

## 2012-02-27 DIAGNOSIS — R7301 Impaired fasting glucose: Secondary | ICD-10-CM

## 2012-02-27 DIAGNOSIS — E871 Hypo-osmolality and hyponatremia: Secondary | ICD-10-CM

## 2012-02-27 DIAGNOSIS — M79609 Pain in unspecified limb: Secondary | ICD-10-CM

## 2012-02-27 MED ORDER — OXYCODONE-ACETAMINOPHEN 5-325 MG PO TABS: 1.0000 | ORAL_TABLET | Freq: Four times a day (QID) | ORAL | Status: DC | PRN

## 2012-02-27 MED ORDER — DOXYCYCLINE HYCLATE 50 MG PO CAPS: 100.0000 mg | ORAL_CAPSULE | Freq: Two times a day (BID) | ORAL | Status: DC

## 2012-02-27 NOTE — Discharge Summary (Signed)
Physician Discharge Summary  Frank Landry [facility] ZOX:[id number] DOB: [birth year] DOA: 02/25/2012  PCP: No primary provider on file.  Admit date: 02/25/2012 Discharge date: 02/27/2012  Discharge Diagnoses:  Principal Problem:  *Cellulitis Active Problems:  Hyponatremia  Impaired fasting glucose  Adverse drug reaction to Zosyn (rash)   Discharge Condition: medically stable for discharge home today; patient insists on discharge home today  Diet recommendation: as tolerated  History of present illness:  51 year old man with no significant PMH who initially presented to the ER on 02/23/12 with cellulitis. He had negative doppler studies done during that visit, and was discharged home on a course of Keflex with instructions to return if his symptoms worsen. He re-presented on 02/25/12 with worsening left lower extremity erythema and swelling. A CT scan was done which did not show any evidence of deep abscess, but he was admitted for outpatient failure of treatment of cellulitis. He is currently being treated with empiric Vancomycin/Zosyn, but has developed a pruritic rash so Zosyn stopped.   Assessment/Plan:   Principal Problem:  *Cellulitis  Admitted and placed on empiric Vancomycin/Zosyn after failing outpatient therapy with Keflex. No evidence of DVT or deep abscess based on dopplers and CT scan of the LLE.  Zosyn stopped 02/26/12 due to the development of a pruritic rash.  Pt reports improvement in cellulitis We will discharge patient with doxycycline 100 mg BID for 2 weeks  Active Problems:  Hyponatremia  Mild, unclear etiology. Impaired fasting glucose  Please note that the patient did not want to wait for final result of A1c which is still pending; I have encouraged him to follow up with PCP in regards to blood sugar control Adverse drug reaction to Zosyn (rash)  D/Ced  Zosyn.  Benadryl PRN pruritis.  Code Status: Full  Family Communication: Family updated at bedside.    Disposition Plan: Home today  Medical Consultants:  None. Other Consultants:  Dietician Procedures:  None. Antibiotics:  Vancomycin 02/25/12---> 02/27/2012 Zosyn 02/25/12--->02/27/2012   Discharge Exam: Filed Vitals:   02/27/12 0605  BP: 128/89  Pulse: 65  Temp: 98.4 F (36.9 C)  Resp: 19   Filed Vitals:   02/26/12 0606 02/26/12 1335 02/26/12 2025 02/27/12 0605  BP: 117/71 113/71 129/78 128/89  Pulse: 81 82 72 65  Temp: 98.5 F (36.9 C) 98.9 F (37.2 C) 98.3 F (36.8 C) 98.4 F (36.9 C)  TempSrc: Oral Oral Oral Oral  Resp: 16 18 18 19   Height:      Weight:      SpO2: 97% 98% 100% 100%    General: Pt is alert, follows commands appropriately, not in acute distress Cardiovascular: Regular rate and rhythm, S1/S2 +, no murmurs, no rubs, no gallops Respiratory: Clear to auscultation bilaterally, no wheezing, no crackles, no rhonchi Abdominal: Soft, non tender, non distended, bowel sounds +, no guarding Extremities: Left LE swelling, erythema but per patient improving, no cyanosis, pulses palpable bilaterally DP and PT Neuro: Grossly nonfocal  Discharge Instructions  Discharge Orders    Future Orders Please Complete By Expires   Diet - low sodium heart healthy      Increase activity slowly      Discharge instructions      Comments:   PLEASE CONTINUE TAKING DOXYCYCLINE FOR 2 WEEKS AND THEN FOLLOW UP WITH YOUR PCP FOR RESOLUTION OF SYMPTOMS   Call MD for:  persistant nausea and vomiting      Call MD for:  severe uncontrolled pain      Call  MD for:  difficulty breathing, headache or visual disturbances      Call MD for:  persistant dizziness or light-headedness          Medication List     As of 02/27/2012  9:19 AM    STOP taking these medications         cephALEXin 500 MG capsule   Commonly known as: KEFLEX      TAKE these medications         doxycycline 50 MG capsule   Commonly known as: VIBRAMYCIN   Take 2 capsules (100 mg total) by mouth 2 (two) times  daily.      oxyCODONE-acetaminophen 5-325 MG per tablet   Commonly known as: PERCOCET/ROXICET   Take 1 tablet by mouth every 6 (six) hours as needed for pain.          The results of significant diagnostics from this hospitalization (including imaging, microbiology, ancillary and laboratory) are listed below for reference.    Significant Diagnostic Studies: Ct Foot Left W Contrast 03-07-2012  *  IMPRESSION: 1.  Soft tissue swelling along the left ankle tracking in the dorsum of the left foot.  This could certainly represent cellulitis.  No drainable abscess or bony destructive findings typical for osteomyelitis identified.  2.  Scattered spurring in the foot.   Original Report Authenticated By: Dellia Cloud, M.D.     Microbiology: No results found for this or any previous visit (from the past 240 hour(s)).   Labs: Basic Metabolic Panel:  Lab 02/26/12 1610 03/07/12 0825 02/23/12 1110  NA 133* 134* 136  K 3.9 4.8 4.1  CL 98 99 103  CO2 26 25 24   GLUCOSE 122* 121* 130*  BUN 11 11 12   CREATININE 1.15 1.06 0.98  CALCIUM 9.1 9.8 9.4  MG -- -- --  PHOS -- -- --   Liver Function Tests:  Lab 02/26/12 0330  AST 19  ALT 16  ALKPHOS 70  BILITOT 0.4  PROT 6.8  ALBUMIN 3.4*   No results found for this basename: LIPASE:5,AMYLASE:5 in the last 168 hours No results found for this basename: AMMONIA:5 in the last 168 hours CBC:  Lab 2012/03/07 0825 02/23/12 1110  WBC 7.4 8.4  NEUTROABS 4.6 5.3  HGB 14.6 13.9  HCT 42.0 40.3  MCV 92.3 92.9  PLT 235 217   Cardiac Enzymes: No results found for this basename: CKTOTAL:5,CKMB:5,CKMBINDEX:5,TROPONINI:5 in the last 168 hours BNP: BNP (last 3 results) No results found for this basename: PROBNP:3 in the last 8760 hours CBG: No results found for this basename: GLUCAP:5 in the last 168 hours  Time coordinating discharge: Over 30 minutes  Signed:  Manson Passey, MD  [facility] 02/27/2012, 9:19 AM  Pager #: [phone]

## 2012-02-27 NOTE — Progress Notes (Signed)
Pt with girlfriend at bedside given d/c instructions, when to call MD, f/u appts, ABX, meds, verbalized understanding. Belongings packed, awaiting ride.

## 2015-07-10 ENCOUNTER — Emergency Department (HOSPITAL_COMMUNITY)
Admission: EM | Admit: 2015-07-10 | Discharge: 2015-07-10 | Disposition: A | Discharge status: Home / Self Care | Payer: Self-pay | Attending: Emergency Medicine | Admitting: Emergency Medicine

## 2015-07-10 ENCOUNTER — Encounter (HOSPITAL_COMMUNITY): Payer: Self-pay

## 2015-07-10 DIAGNOSIS — F17210 Nicotine dependence, cigarettes, uncomplicated: Secondary | ICD-10-CM | POA: Insufficient documentation

## 2015-07-10 DIAGNOSIS — L03115 Cellulitis of right lower limb: Secondary | ICD-10-CM | POA: Insufficient documentation

## 2015-07-10 DIAGNOSIS — Z23 Encounter for immunization: Secondary | ICD-10-CM | POA: Insufficient documentation

## 2015-07-10 DIAGNOSIS — Z792 Long term (current) use of antibiotics: Secondary | ICD-10-CM | POA: Insufficient documentation

## 2015-07-10 MED ORDER — TETANUS-DIPHTH-ACELL PERTUSSIS 5-2.5-18.5 LF-MCG/0.5 IM SUSP
0.5000 mL | Freq: Once | INTRAMUSCULAR | Status: AC
  Administered 2015-07-10: 0.5 mL via INTRAMUSCULAR
  Filled 2015-07-10: qty 0.5

## 2015-07-10 MED ORDER — CEPHALEXIN 500 MG PO CAPS: 500.0000 mg | ORAL_CAPSULE | Freq: Four times a day (QID) | ORAL | Status: DC

## 2015-07-10 NOTE — ED Provider Notes (Signed)
CSN: [id number]     Arrival date & time 07/10/15  0715 History   First MD Initiated Contact with Patient 07/10/15 [phone]     Chief Complaint  Patient presents with  . Leg Swelling  . Rash     (Consider location/radiation/quality/duration/timing/severity/associated sxs/prior Treatment) Patient is a 55 y.o. male presenting with rash and general illness. The history is provided by the patient.  Rash Associated symptoms: no abdominal pain, no diarrhea, no fever, no headaches, no joint pain, no myalgias, no nausea, no shortness of breath and not vomiting   Illness Severity:  Mild Onset quality:  Sudden Duration:  2 days Timing:  Constant Progression:  Unchanged Chronicity:  Recurrent Associated symptoms: no abdominal pain, no chest pain, no congestion, no diarrhea, no fever, no headaches, no myalgias, no nausea, no rash, no shortness of breath and no vomiting     55 yo M with a chief complaint of right lower extremity swelling. Patient states he had a small break in the skin that he think was secondary to scratching his dry skin. Since then has been applying alcohol and has had some spreading erythema. Patient has a history of cellulitis to his legs in the past. Denies history of blood clot. Areas mildly painful with a burning sensation. Patient is also complaining that he is not had his tetanus shot in greater than 30 years.  Past Medical History  Diagnosis Date  . No pertinent past medical history    Past Surgical History  Procedure Laterality Date  . No past surgeries    . Hernia repair     Family History  Problem Relation Age of Onset  . Diabetes Mother   . Cancer Father    Social History  Substance Use Topics  . Smoking status: Current Some Day Smoker -- 0.25 packs/day for 38 years    Types: Cigarettes  . Smokeless tobacco: Never Used  . Alcohol Use: Yes     Comment: occasionally    Review of Systems  Constitutional: Negative for fever and chills.  HENT: Negative for  congestion and facial swelling.   Eyes: Negative for discharge and visual disturbance.  Respiratory: Negative for shortness of breath.   Cardiovascular: Positive for leg swelling. Negative for chest pain and palpitations.  Gastrointestinal: Negative for nausea, vomiting, abdominal pain and diarrhea.  Musculoskeletal: Negative for myalgias and arthralgias.  Skin: Positive for color change. Negative for rash.  Neurological: Negative for tremors, syncope and headaches.  Psychiatric/Behavioral: Negative for confusion and dysphoric mood.      Allergies  Zosyn  Home Medications   Prior to Admission medications   Medication Sig Start Date End Date Taking? Authorizing Provider  cephALEXin (KEFLEX) 500 MG capsule Take 1 capsule (500 mg total) by mouth 4 (four) times daily. 07/10/15   Melene Plan, DO  doxycycline (VIBRAMYCIN) 50 MG capsule Take 2 capsules (100 mg total) by mouth 2 (two) times daily. 02/27/12   Alison Murray, MD  oxyCODONE-acetaminophen (PERCOCET/ROXICET) 5-325 MG per tablet Take 1 tablet by mouth every 6 (six) hours as needed for pain. 02/27/12   Alison Murray, MD   BP 133/90 mmHg  Pulse 65  Temp(Src) 98.1 F (36.7 C) (Oral)  Resp 18  SpO2 97% Physical Exam  Constitutional: He is oriented to person, place, and time. He appears well-developed and well-nourished.  HENT:  Head: Normocephalic and atraumatic.  Eyes: EOM are normal. Pupils are equal, round, and reactive to light.  Neck: Normal range of motion. Neck supple.  No JVD present.  Cardiovascular: Normal rate and regular rhythm.  Exam reveals no gallop and no friction rub.   No murmur heard. Pulmonary/Chest: No respiratory distress. He has no wheezes.  Abdominal: He exhibits no distension. There is no rebound and no guarding.  Musculoskeletal: Normal range of motion. He exhibits edema.       Legs: Neurological: He is alert and oriented to person, place, and time.  Skin: No rash noted. No pallor.  Psychiatric: He has a  normal mood and affect. His behavior is normal.  Nursing note and vitals reviewed.   ED Course  Procedures (including critical care time) Labs Review Labs Reviewed - No data to display  Imaging Review No results found. I have personally reviewed and evaluated these images and lab results as part of my medical decision-making.   EKG Interpretation None      MDM   Final diagnoses:  Cellulitis of right lower extremity    55 yo M with a chief complaints of right lower extremity swelling with surrounding erythema. This consistent with cellulitis. Feel the DVT is unlikely as it's not circumferential swelling is localized just around the area of erythema. Will start him on Keflex. Given referral for follow-up.   7:57 AM:  I have discussed the diagnosis/risks/treatment options with the patient and believe the pt to be eligible for discharge home to follow-up with PCP. We also discussed returning to the ED immediately if new or worsening sx occur. We discussed the sx which are most concerning (e.g., sudden worsening pain, fever, inability to tolerate by mouth, spreading erythema) that necessitate immediate return. Medications administered to the patient during their visit and any new prescriptions provided to the patient are listed below.  Medications given during this visit Medications  Tdap (BOOSTRIX) injection 0.5 mL (not administered)    New Prescriptions   CEPHALEXIN (KEFLEX) 500 MG CAPSULE    Take 1 capsule (500 mg total) by mouth 4 (four) times daily.    The patient appears reasonably screen and/or stabilized for discharge and I doubt any other medical condition or other [facility] requiring further screening, evaluation, or treatment in the ED at this time prior to discharge.      Melene Plan, DO 07/10/15 [phone]

## 2015-07-10 NOTE — ED Notes (Signed)
Pt has always had dry skin.  Noted recently he has had increase in rash and swelling in bilateral legs.

## 2015-07-10 NOTE — Discharge Instructions (Signed)
Do not put alcohol on this as it will dry out your skin and may make the condition worse. Cellulitis Cellulitis is an infection of the skin and the tissue beneath it. The infected area is usually red and tender. Cellulitis occurs most often in the arms and lower legs.  CAUSES  Cellulitis is caused by bacteria that enter the skin through cracks or cuts in the skin. The most common types of bacteria that cause cellulitis are staphylococci and streptococci. SIGNS AND SYMPTOMS   Redness and warmth.  Swelling.  Tenderness or pain.  Fever. DIAGNOSIS  Your health care provider can usually determine what is wrong based on a physical exam. Blood tests may also be done. TREATMENT  Treatment usually involves taking an antibiotic medicine. HOME CARE INSTRUCTIONS   Take your antibiotic medicine as directed by your health care provider. Finish the antibiotic even if you start to feel better.  Keep the infected arm or leg elevated to reduce swelling.  Apply a warm cloth to the affected area up to 4 times per day to relieve pain.  Take medicines only as directed by your health care provider.  Keep all follow-up visits as directed by your health care provider. SEEK MEDICAL CARE IF:   You notice red streaks coming from the infected area.  Your red area gets larger or turns dark in color.  Your bone or joint underneath the infected area becomes painful after the skin has healed.  Your infection returns in the same area or another area.  You notice a swollen bump in the infected area.  You develop new symptoms.  You have a fever. SEEK IMMEDIATE MEDICAL CARE IF:   You feel very sleepy.  You develop vomiting or diarrhea.  You have a general ill feeling (malaise) with muscle aches and pains.   This information is not intended to replace advice given to you by your health care provider. Make sure you discuss any questions you have with your health care provider.   Document Released:  02/26/2005 Document Revised: 02/07/2015 Document Reviewed: 08/04/2011 Elsevier Interactive Patient Education [employer]

## 2015-10-08 ENCOUNTER — Emergency Department (EMERGENCY_DEPARTMENT_HOSPITAL)
Admit: 2015-10-08 | Discharge: 2015-10-08 | Disposition: A | Discharge status: Home / Self Care | Payer: Self-pay | Attending: Emergency Medicine | Admitting: Emergency Medicine

## 2015-10-08 ENCOUNTER — Emergency Department (HOSPITAL_COMMUNITY)
Admission: EM | Admit: 2015-10-08 | Discharge: 2015-10-08 | Disposition: A | Discharge status: Home / Self Care | Payer: Self-pay | Attending: Emergency Medicine | Admitting: Emergency Medicine

## 2015-10-08 ENCOUNTER — Encounter (HOSPITAL_COMMUNITY): Payer: Self-pay

## 2015-10-08 DIAGNOSIS — R21 Rash and other nonspecific skin eruption: Secondary | ICD-10-CM

## 2015-10-08 DIAGNOSIS — L03115 Cellulitis of right lower limb: Secondary | ICD-10-CM | POA: Insufficient documentation

## 2015-10-08 DIAGNOSIS — Z792 Long term (current) use of antibiotics: Secondary | ICD-10-CM | POA: Insufficient documentation

## 2015-10-08 DIAGNOSIS — M79604 Pain in right leg: Secondary | ICD-10-CM

## 2015-10-08 DIAGNOSIS — Z79891 Long term (current) use of opiate analgesic: Secondary | ICD-10-CM | POA: Insufficient documentation

## 2015-10-08 DIAGNOSIS — F17210 Nicotine dependence, cigarettes, uncomplicated: Secondary | ICD-10-CM | POA: Insufficient documentation

## 2015-10-08 DIAGNOSIS — Z79899 Other long term (current) drug therapy: Secondary | ICD-10-CM | POA: Insufficient documentation

## 2015-10-08 LAB — COMPREHENSIVE METABOLIC PANEL
ALBUMIN: 4.1 g/dL (ref 3.5–5.0)
ALK PHOS: 73 U/L (ref 38–126)
ALT: 21 U/L (ref 17–63)
AST: 27 U/L (ref 15–41)
Anion gap: 6 (ref 5–15)
BILIRUBIN TOTAL: 0.8 mg/dL (ref 0.3–1.2)
BUN: 10 mg/dL (ref 6–20)
CALCIUM: 9.6 mg/dL (ref 8.9–10.3)
CO2: 28 mmol/L (ref 22–32)
Chloride: 106 mmol/L (ref 101–111)
Creatinine, Ser: 1.11 mg/dL (ref 0.61–1.24)
GFR calc Af Amer: >60 mL/min (ref >60)
GFR calc non Af Amer: >60 mL/min (ref >60)
GLUCOSE: 207 mg/dL — AB (ref 65–99)
Potassium: 4.5 mmol/L (ref 3.5–5.1)
SODIUM: 140 mmol/L (ref 135–145)
Total Protein: 6.9 g/dL (ref 6.5–8.1)

## 2015-10-08 LAB — CBC WITH DIFFERENTIAL/PLATELET
BASOS PCT: 0 %
Basophils Absolute: 0 10*3/uL (ref 0.0–0.1)
EOS ABS: 0.3 10*3/uL (ref 0.0–0.7)
Eosinophils Relative: 6 %
HEMATOCRIT: 42.4 % (ref 39.0–52.0)
HEMOGLOBIN: 14.3 g/dL (ref 13.0–17.0)
Lymphocytes Relative: 32 %
Lymphs Abs: 1.8 10*3/uL (ref 0.7–4.0)
MCH: 31.6 pg (ref 26.0–34.0)
MCHC: 33.7 g/dL (ref 30.0–36.0)
MCV: 93.8 fL (ref 78.0–100.0)
MONOS PCT: 9 %
Monocytes Absolute: 0.5 10*3/uL (ref 0.1–1.0)
NEUTROS ABS: 3 10*3/uL (ref 1.7–7.7)
NEUTROS PCT: 53 %
Platelets: 198 10*3/uL (ref 150–400)
RBC: 4.52 MIL/uL (ref 4.22–5.81)
RDW: 13.8 % (ref 11.5–15.5)
WBC: 5.6 10*3/uL (ref 4.0–10.5)

## 2015-10-08 MED ORDER — DIPHENHYDRAMINE HCL 25 MG PO CAPS
25.0000 mg | ORAL_CAPSULE | Freq: Once | ORAL | Status: AC
  Administered 2015-10-08: 25 mg via ORAL
  Filled 2015-10-08: qty 1

## 2015-10-08 MED ORDER — DIPHENHYDRAMINE HCL 25 MG PO CAPS: 25.0000 mg | ORAL_CAPSULE | Freq: Four times a day (QID) | ORAL | Status: DC | PRN

## 2015-10-08 MED ORDER — CEPHALEXIN 500 MG PO CAPS: 500.0000 mg | ORAL_CAPSULE | Freq: Four times a day (QID) | ORAL | Status: DC

## 2015-10-08 MED ORDER — CETIRIZINE HCL 10 MG PO TABS: 10.0000 mg | ORAL_TABLET | Freq: Every day | ORAL | Status: DC

## 2015-10-08 MED ORDER — SULFAMETHOXAZOLE-TRIMETHOPRIM 800-160 MG PO TABS: 1.0000 | ORAL_TABLET | Freq: Two times a day (BID) | ORAL | Status: AC

## 2015-10-08 NOTE — ED Provider Notes (Signed)
CSN: [id number]     Arrival date & time 10/08/15  7846 History   First MD Initiated Contact with Patient 10/08/15 [phone]     Chief Complaint  Patient presents with  . Rash     (Consider location/radiation/quality/duration/timing/severity/associated sxs/prior Treatment) HPI Comments: Patient is a 55 year old male with history of cellulitis who presents with a generalized rash. Patient reports the rash began 4 days ago. It is worse on his chest and back, but is present on his extremities as well. It spares his hands and feet. Patient states that the rash is very itchy and painful. He rates his pain as 7/10. Patient also reports right leg pain and redness. Patient was seen in February for a cellulitis infection that cleared with Keflex, but the patient still has some pain when he stands on it for a while. Patient states it began to feel more painful and swollen when his rash began 4 days ago. Patient has tried Sarna anti-itch lotion and calamine lotion at home with minimal relief. Patient denies fevers, chest pain, shortness of breath, abdominal pain, nausea, vomiting, dysuria.  Patient is a 55 y.o. male presenting with rash. The history is provided by the patient.  Rash Associated symptoms: no abdominal pain, no fever, no headaches, no nausea, no shortness of breath, no sore throat and not vomiting     Past Medical History  Diagnosis Date  . No pertinent past medical history    Past Surgical History  Procedure Laterality Date  . No past surgeries    . Hernia repair     Family History  Problem Relation Age of Onset  . Diabetes Mother   . Cancer Father    Social History  Substance Use Topics  . Smoking status: Current Some Day Smoker -- 0.25 packs/day for 38 years    Types: Cigarettes  . Smokeless tobacco: Never Used  . Alcohol Use: Yes     Comment: occasionally    Review of Systems  Constitutional: Negative for fever and chills.  HENT: Negative for facial swelling and sore throat.    Respiratory: Negative for shortness of breath.   Cardiovascular: Negative for chest pain.  Gastrointestinal: Negative for nausea, vomiting and abdominal pain.  Genitourinary: Negative for dysuria.  Musculoskeletal: Negative for back pain.  Skin: Positive for rash. Negative for wound.  Neurological: Negative for headaches.  Psychiatric/Behavioral: The patient is not nervous/anxious.       Allergies  Zosyn  Home Medications   Prior to Admission medications   Medication Sig Start Date End Date Taking? Authorizing Provider  cephALEXin (KEFLEX) 500 MG capsule Take 1 capsule (500 mg total) by mouth 4 (four) times daily. 10/08/15   Emi Holes, PA-C  cetirizine (ZYRTEC ALLERGY) 10 MG tablet Take 1 tablet (10 mg total) by mouth daily. 10/08/15   Emi Holes, PA-C  diphenhydrAMINE (BENADRYL) 25 mg capsule Take 1 capsule (25 mg total) by mouth every 6 (six) hours as needed. 10/08/15   Emi Holes, PA-C  doxycycline (VIBRAMYCIN) 50 MG capsule Take 2 capsules (100 mg total) by mouth 2 (two) times daily. 02/27/12   Alison Murray, MD  oxyCODONE-acetaminophen (PERCOCET/ROXICET) 5-325 MG per tablet Take 1 tablet by mouth every 6 (six) hours as needed for pain. 02/27/12   Alison Murray, MD  sulfamethoxazole-trimethoprim (BACTRIM DS,SEPTRA DS) 800-160 MG tablet Take 1 tablet by mouth 2 (two) times daily. 10/08/15 10/15/15  Kanyla Omeara M Akio Hudnall, PA-C   BP 136/90 mmHg  Pulse 64  Temp(Src)  98.4 F (36.9 C) (Oral)  Resp 16  Ht 6' 1.5" (1.867 m)  Wt 110.678 kg  BMI 31.75 kg/m2  SpO2 99% Physical Exam  Constitutional: He appears well-developed and well-nourished. No distress.  HENT:  Head: Normocephalic and atraumatic.  Mouth/Throat: Oropharynx is clear and moist. No oropharyngeal exudate.  Eyes: Conjunctivae are normal. Pupils are equal, round, and reactive to light. Right eye exhibits no discharge. Left eye exhibits no discharge. No scleral icterus.  Neck: Normal range of motion. Neck supple. No  thyromegaly present.  Cardiovascular: Normal rate, regular rhythm, normal heart sounds and intact distal pulses.  Exam reveals no gallop and no friction rub.   No murmur heard. Pulmonary/Chest: Effort normal and breath sounds normal. No stridor. No respiratory distress. He has no wheezes. He has no rales.  Abdominal: Soft. Bowel sounds are normal. He exhibits no distension. There is no tenderness. There is no rebound and no guarding.  Musculoskeletal: He exhibits edema (R LE).  Lymphadenopathy:    He has no cervical adenopathy.  Neurological: He is alert. Coordination normal.  Skin: Skin is warm and dry. Rash noted. Rash is papular and urticarial. He is not diaphoretic. No pallor.     Generalized, papular rash with erythematous base on chest, back, extremities; area of redness to right lower leg, tenderness and edema present circumferentially to right lower leg  Psychiatric: He has a normal mood and affect.  Nursing note and vitals reviewed.   ED Course  Procedures (including critical care time) Labs Review Labs Reviewed  COMPREHENSIVE METABOLIC PANEL - Abnormal; Notable for the following:    Glucose, Bld 207 (*)    All other components within normal limits  CBC WITH DIFFERENTIAL/PLATELET    Imaging Review No results found. I have personally reviewed and evaluated these images and lab results as part of my medical decision-making.   EKG Interpretation None      MDM   Most likely allergic generalized rash and cellulitis to right lower leg. CBC unremarkable. CMP shows glucose 207; LFTs and kidney function WNLs. DVT study shows no DVT or baker's cyst. Patient discharged with Benadryl, Zyrtec, Bactrim, Keflex. Strict return precautions given. Patient to establish care and [facility] for further evaluation of patient's recurrent skin infections and most likely diabetes. Patient also evaluated by Dr. Cyndie ChimeNguyen who is in agreement with plan. Patient vitals stable  throughout ED course and discharged in in satisfactory condition.   Final diagnoses:  Right leg pain  Generalized papular rash  Cellulitis of right lower extremity       Emi Holeslexandra M Durk Carmen, PA-C 10/08/15 1449  Leta BaptistEmily Roe Nguyen, MD 10/10/15 [phone]

## 2015-10-08 NOTE — Progress Notes (Signed)
*  PRELIMINARY RESULTS* Vascular Ultrasound Right lower extremity venous duplex has been completed.  Preliminary findings: No evidence of DVT or baker's cyst.   Farrel DemarkJill Eunice, RDMS, RVT  10/08/2015, 10:58 AM

## 2015-10-08 NOTE — Discharge Instructions (Signed)
Medications: Bactrim, Keflex, Benadryl, Zyrtec  Treatment: Please take Bactrim and Keflex for your skin infection as prescribed for 1 week. Take Benadryl every 6 hours while awake and Zyrtec once daily until resolution of your rash. You may use an over-the-counter anti-itch lotion or cream.  Follow-up: Please follow-up with the primary care provider listed to establish care and evaluate further your elevated blood sugars and chronic skin infections. Please return the emergency department if you develop any fevers, increasing redness, streaking up her leg, increasing pain or drainage in your leg.   Cellulitis Cellulitis is an infection of the skin and the tissue beneath it. The infected area is usually red and tender. Cellulitis occurs most often in the arms and lower legs.  CAUSES  Cellulitis is caused by bacteria that enter the skin through cracks or cuts in the skin. The most common types of bacteria that cause cellulitis are staphylococci and streptococci. SIGNS AND SYMPTOMS   Redness and warmth.  Swelling.  Tenderness or pain.  Fever. DIAGNOSIS  Your health care provider can usually determine what is wrong based on a physical exam. Blood tests may also be done. TREATMENT  Treatment usually involves taking an antibiotic medicine. HOME CARE INSTRUCTIONS   Take your antibiotic medicine as directed by your health care provider. Finish the antibiotic even if you start to feel better.  Keep the infected arm or leg elevated to reduce swelling.  Apply a warm cloth to the affected area up to 4 times per day to relieve pain.  Take medicines only as directed by your health care provider.  Keep all follow-up visits as directed by your health care provider. SEEK MEDICAL CARE IF:   You notice red streaks coming from the infected area.  Your red area gets larger or turns dark in color.  Your bone or joint underneath the infected area becomes painful after the skin has healed.  Your  infection returns in the same area or another area.  You notice a swollen bump in the infected area.  You develop new symptoms.  You have a fever. SEEK IMMEDIATE MEDICAL CARE IF:   You feel very sleepy.  You develop vomiting or diarrhea.  You have a general ill feeling (malaise) with muscle aches and pains.   This information is not intended to replace advice given to you by your health care provider. Make sure you discuss any questions you have with your health care provider.   Document Released: 02/26/2005 Document Revised: 02/07/2015 Document Reviewed: 08/04/2011 Elsevier Interactive Patient Education [employer]2016 Elsevier Inc.

## 2015-10-08 NOTE — ED Notes (Signed)
Pt c/o increasing generalized rash x 4 days.  Pain score 7/10.  Pt reports using Sarna anti-itch lotion x 3 days.  Pt unsure of new laundry detergents/soaps.

## 2015-10-08 NOTE — ED Notes (Signed)
Patient also has reddened lower right leg redness and pain that he rates as a 3.  He reports he was treated here in February for that problem.  This is the first time he has had a rash on his chest and back.  Has not started any new medications.

## 2015-10-12 ENCOUNTER — Encounter (HOSPITAL_COMMUNITY): Payer: Self-pay | Admitting: *Deleted

## 2015-10-12 ENCOUNTER — Emergency Department (HOSPITAL_COMMUNITY)
Admission: EM | Admit: 2015-10-12 | Discharge: 2015-10-12 | Disposition: A | Discharge status: Home / Self Care | Payer: MEDICAID | Attending: Emergency Medicine | Admitting: Emergency Medicine

## 2015-10-12 DIAGNOSIS — Z79899 Other long term (current) drug therapy: Secondary | ICD-10-CM | POA: Insufficient documentation

## 2015-10-12 DIAGNOSIS — R109 Unspecified abdominal pain: Secondary | ICD-10-CM

## 2015-10-12 DIAGNOSIS — F17210 Nicotine dependence, cigarettes, uncomplicated: Secondary | ICD-10-CM | POA: Insufficient documentation

## 2015-10-12 DIAGNOSIS — Z792 Long term (current) use of antibiotics: Secondary | ICD-10-CM | POA: Insufficient documentation

## 2015-10-12 DIAGNOSIS — K5900 Constipation, unspecified: Secondary | ICD-10-CM | POA: Insufficient documentation

## 2015-10-12 LAB — URINALYSIS, ROUTINE W REFLEX MICROSCOPIC
Bilirubin Urine: NEGATIVE
Glucose, UA: NEGATIVE mg/dL
Hgb urine dipstick: NEGATIVE
Ketones, ur: NEGATIVE mg/dL
Leukocytes, UA: NEGATIVE
Nitrite: NEGATIVE
Protein, ur: NEGATIVE mg/dL
Specific Gravity, Urine: 1.025 (ref 1.005–1.030)
pH: 6 (ref 5.0–8.0)

## 2015-10-12 MED ORDER — POLYETHYLENE GLYCOL 3350 17 G PO PACK
34.0000 g | PACK | Freq: Once | ORAL | Status: AC
  Administered 2015-10-12: 34 g via ORAL
  Filled 2015-10-12: qty 2

## 2015-10-12 MED ORDER — POLYETHYLENE GLYCOL 3350 17 G PO PACK: 17.0000 g | PACK | Freq: Two times a day (BID) | ORAL | Status: DC | PRN

## 2015-10-12 MED ORDER — MAGNESIUM CITRATE PO SOLN
1.0000 | Freq: Once | ORAL | Status: AC
  Administered 2015-10-12: 1 via ORAL
  Filled 2015-10-12: qty 296

## 2015-10-12 NOTE — Discharge Instructions (Signed)
Abdominal Pain, Adult °Many things can cause abdominal pain. Usually, abdominal pain is not caused by a disease and will improve without treatment. It can often be observed and treated at home. Your health care provider will do a physical exam and possibly order blood tests and X-rays to help determine the seriousness of your pain. However, in many cases, more time must pass before a clear cause of the pain can be found. Before that point, your health care provider may not know if you need more testing or further treatment. °HOME CARE INSTRUCTIONS °Monitor your abdominal pain for any changes. The following actions may help to alleviate any discomfort you are experiencing: °· Only take over-the-counter or prescription medicines as directed by your health care provider. °· Do not take laxatives unless directed to do so by your health care provider. °· Try a clear liquid diet (broth, tea, or water) as directed by your health care provider. Slowly move to a bland diet as tolerated. °SEEK MEDICAL CARE IF: °· You have unexplained abdominal pain. °· You have abdominal pain associated with nausea or diarrhea. °· You have pain when you urinate or have a bowel movement. °· You experience abdominal pain that wakes you in the night. °· You have abdominal pain that is worsened or improved by eating food. °· You have abdominal pain that is worsened with eating fatty foods. °· You have a fever. °SEEK IMMEDIATE MEDICAL CARE IF: °· Your pain does not go away within 2 hours. °· You keep throwing up (vomiting). °· Your pain is felt only in portions of the abdomen, such as the right side or the left lower portion of the abdomen. °· You pass bloody or black tarry stools. °MAKE SURE YOU: °· Understand these instructions. °· Will watch your condition. °· Will get help right away if you are not doing well or get worse. °  °This information is not intended to replace advice given to you by your health care provider. Make sure you discuss  any questions you have with your health care provider. °  °Document Released: 02/26/2005 Document Revised: 02/07/2015 Document Reviewed: 01/26/2013 °Elsevier Interactive Patient Education ©2016 Elsevier Inc. ° °Constipation, Adult °Constipation is when a person has fewer than three bowel movements a week, has difficulty having a bowel movement, or has stools that are dry, hard, or larger than normal. As people grow older, constipation is more common. A low-fiber diet, not taking in enough fluids, and taking certain medicines may make constipation worse.  °CAUSES  °· Certain medicines, such as antidepressants, pain medicine, iron supplements, antacids, and water pills.   °· Certain diseases, such as diabetes, irritable bowel syndrome (IBS), thyroid disease, or depression.   °· Not drinking enough water.   °· Not eating enough fiber-rich foods.   °· Stress or travel.   °· Lack of physical activity or exercise.   °· Ignoring the urge to have a bowel movement.   °· Using laxatives too much.   °SIGNS AND SYMPTOMS  °· Having fewer than three bowel movements a week.   °· Straining to have a bowel movement.   °· Having stools that are hard, dry, or larger than normal.   °· Feeling full or bloated.   °· Pain in the lower abdomen.   °· Not feeling relief after having a bowel movement.   °DIAGNOSIS  °Your health care provider will take a medical history and perform a physical exam. Further testing may be done for severe constipation. Some tests may include: °· A barium enema X-ray to examine your rectum, colon, and, sometimes,   your small intestine.   °· A sigmoidoscopy to examine your lower colon.   °· A colonoscopy to examine your entire colon. °TREATMENT  °Treatment will depend on the severity of your constipation and what is causing it. Some dietary treatments include drinking more fluids and eating more fiber-rich foods. Lifestyle treatments may include regular exercise. If these diet and lifestyle recommendations do not  help, your health care provider may recommend taking over-the-counter laxative medicines to help you have bowel movements. Prescription medicines may be prescribed if over-the-counter medicines do not work.  °HOME CARE INSTRUCTIONS  °· Eat foods that have a lot of fiber, such as fruits, vegetables, whole grains, and beans. °· Limit foods high in fat and processed sugars, such as french fries, hamburgers, cookies, candies, and soda.   °· A fiber supplement may be added to your diet if you cannot get enough fiber from foods.   °· Drink enough fluids to keep your urine clear or pale yellow.   °· Exercise regularly or as directed by your health care provider.   °· Go to the restroom when you have the urge to go. Do not hold it.   °· Only take over-the-counter or prescription medicines as directed by your health care provider. Do not take other medicines for constipation without talking to your health care provider first.   °SEEK IMMEDIATE MEDICAL CARE IF:  °· You have bright red blood in your stool.   °· Your constipation lasts for more than 4 days or gets worse.   °· You have abdominal or rectal pain.   °· You have thin, pencil-like stools.   °· You have unexplained weight loss. °MAKE SURE YOU:  °· Understand these instructions. °· Will watch your condition. °· Will get help right away if you are not doing well or get worse. °  °This information is not intended to replace advice given to you by your health care provider. Make sure you discuss any questions you have with your health care provider. °  °Document Released: 02/15/2004 Document Revised: 06/09/2014 Document Reviewed: 02/28/2013 °Elsevier Interactive Patient Education ©2016 Elsevier Inc. ° °

## 2015-10-12 NOTE — ED Notes (Signed)
Pt reports constipation.  LBM-Monday.  Pt reports abd pain. States normally have a BM x 3 daily.  Denies taking any narcotics at this time.  Pt reports taking laxatives yesterday without relief.

## 2015-10-12 NOTE — ED Provider Notes (Signed)
CSN: [id number]     Arrival date & time 10/12/15  4098 History   First MD Initiated Contact with Patient 10/12/15 0725     Chief Complaint  Patient presents with  . Constipation     (Consider location/radiation/quality/duration/timing/severity/associated sxs/prior Treatment) HPI   54yM with abdominal pain. He attributes to constipation. Last BM on Monday. Normally has 2-3 BM per day. Crampy abdominal pain and feels bloated. No n/v. Pain is intermittent. No appreciable exacerbating or relieving factors. Recent meds changes including bactrim/keflex and antihistamines. Denies narcotic pain medication usage. No rectal pain. Sometimes feels pressure in his scrotum when he strain to have BM but denies actual pain. No urinary complaints.   Past Medical History  Diagnosis Date  . No pertinent past medical history    Past Surgical History  Procedure Laterality Date  . No past surgeries    . Hernia repair     Family History  Problem Relation Age of Onset  . Diabetes Mother   . Cancer Father    Social History  Substance Use Topics  . Smoking status: Current Some Day Smoker -- 0.25 packs/day for 38 years    Types: Cigarettes  . Smokeless tobacco: Never Used  . Alcohol Use: Yes     Comment: occasionally    Review of Systems  All systems reviewed and negative, other than as noted in HPI.   Allergies  Zosyn  Home Medications   Prior to Admission medications   Medication Sig Start Date End Date Taking? Authorizing Provider  cephALEXin (KEFLEX) 500 MG capsule Take 1 capsule (500 mg total) by mouth 4 (four) times daily. 10/08/15   Emi Holes, PA-C  cetirizine (ZYRTEC ALLERGY) 10 MG tablet Take 1 tablet (10 mg total) by mouth daily. 10/08/15   Emi Holes, PA-C  diphenhydrAMINE (BENADRYL) 25 mg capsule Take 1 capsule (25 mg total) by mouth every 6 (six) hours as needed. 10/08/15   Emi Holes, PA-C  doxycycline (VIBRAMYCIN) 50 MG capsule Take 2 capsules (100 mg total) by mouth  2 (two) times daily. 02/27/12   Alison Murray, MD  oxyCODONE-acetaminophen (PERCOCET/ROXICET) 5-325 MG per tablet Take 1 tablet by mouth every 6 (six) hours as needed for pain. 02/27/12   Alison Murray, MD  sulfamethoxazole-trimethoprim (BACTRIM DS,SEPTRA DS) 800-160 MG tablet Take 1 tablet by mouth 2 (two) times daily. 10/08/15 10/15/15  Alexandra M Law, PA-C   BP 119/88 mmHg  Pulse 93  Temp(Src) 97.7 F (36.5 C) (Oral)  Resp 16  SpO2 98% Physical Exam  Constitutional: He appears well-developed and well-nourished. No distress.  HENT:  Head: Normocephalic and atraumatic.  Eyes: Conjunctivae are normal. Right eye exhibits no discharge. Left eye exhibits no discharge.  Neck: Neck supple.  Cardiovascular: Normal rate, regular rhythm and normal heart sounds.  Exam reveals no gallop and no friction rub.   No murmur heard. Pulmonary/Chest: Effort normal and breath sounds normal. No respiratory distress.  Abdominal: Soft. He exhibits no distension. There is no tenderness.  Mild distension. Soft. NT. No appreciable hernia.   Musculoskeletal: He exhibits no edema or tenderness.  Neurological: He is alert.  Skin: Skin is warm and dry.  Psychiatric: He has a normal mood and affect. His behavior is normal. Thought content normal.  Nursing note and vitals reviewed.   ED Course  Procedures (including critical care time) Labs Review Labs Reviewed  URINALYSIS, ROUTINE W REFLEX MICROSCOPIC (NOT AT [facility])    Imaging Review No results found. I  have personally reviewed and evaluated these images and lab results as part of my medical decision-making.   EKG Interpretation None      MDM   Final diagnoses:  Abdominal pain, unspecified abdominal location  Constipation, unspecified constipation type    54yM with crampy abdominal pain. Could very well be from constipation. Abdominal exam is benign aside from mild distension. No n/v to suggest obstruction. Will medicate. Check UA.     Raeford RazorStephen  Rolanda Campa, MD 10/12/15 [phone]

## 2016-06-30 ENCOUNTER — Emergency Department (HOSPITAL_COMMUNITY)
Admission: EM | Admit: 2016-06-30 | Discharge: 2016-07-01 | Disposition: A | Discharge status: Home / Self Care | Payer: PRIVATE HEALTH INSURANCE | Attending: Emergency Medicine | Admitting: Emergency Medicine

## 2016-06-30 ENCOUNTER — Encounter (HOSPITAL_COMMUNITY): Payer: Self-pay | Admitting: Emergency Medicine

## 2016-06-30 ENCOUNTER — Emergency Department (HOSPITAL_BASED_OUTPATIENT_CLINIC_OR_DEPARTMENT_OTHER)
Admit: 2016-06-30 | Discharge: 2016-06-30 | Disposition: A | Discharge status: Home / Self Care | Payer: PRIVATE HEALTH INSURANCE | Attending: Emergency Medicine | Admitting: Emergency Medicine

## 2016-06-30 ENCOUNTER — Other Ambulatory Visit: Payer: Self-pay

## 2016-06-30 DIAGNOSIS — M79609 Pain in unspecified limb: Secondary | ICD-10-CM | POA: Diagnosis not present

## 2016-06-30 DIAGNOSIS — M79604 Pain in right leg: Secondary | ICD-10-CM

## 2016-06-30 DIAGNOSIS — M79661 Pain in right lower leg: Secondary | ICD-10-CM | POA: Diagnosis not present

## 2016-06-30 DIAGNOSIS — R0789 Other chest pain: Secondary | ICD-10-CM | POA: Insufficient documentation

## 2016-06-30 DIAGNOSIS — F17210 Nicotine dependence, cigarettes, uncomplicated: Secondary | ICD-10-CM | POA: Diagnosis not present

## 2016-06-30 LAB — CBC WITH DIFFERENTIAL/PLATELET
BASOS ABS: 0 10*3/uL (ref 0.0–0.1)
BASOS PCT: 1 %
EOS ABS: 0.1 10*3/uL (ref 0.0–0.7)
Eosinophils Relative: 3 %
HEMATOCRIT: 40.6 % (ref 39.0–52.0)
Hemoglobin: 13.6 g/dL (ref 13.0–17.0)
Lymphocytes Relative: 46 %
Lymphs Abs: 2.3 10*3/uL (ref 0.7–4.0)
MCH: 31.7 pg (ref 26.0–34.0)
MCHC: 33.5 g/dL (ref 30.0–36.0)
MCV: 94.6 fL (ref 78.0–100.0)
MONO ABS: 0.3 10*3/uL (ref 0.1–1.0)
Monocytes Relative: 7 %
NEUTROS ABS: 2 10*3/uL (ref 1.7–7.7)
NEUTROS PCT: 43 %
Platelets: 203 10*3/uL (ref 150–400)
RBC: 4.29 MIL/uL (ref 4.22–5.81)
RDW: 13.7 % (ref 11.5–15.5)
WBC: 4.8 10*3/uL (ref 4.0–10.5)

## 2016-06-30 LAB — I-STAT TROPONIN, ED: TROPONIN I, POC: 0.01 ng/mL (ref 0.00–0.08)

## 2016-06-30 NOTE — ED Triage Notes (Signed)
Patient presents to ED c/o R posterior calf pain and tenderness x 5 days. Swelling noted to R leg compared to L leg. Pt denies injury or hx of blood clots. States he has swelling in R foot and leg intermittently, but this is new. Pt reports pain occurs most when he stands in the morning and is better with ambulation. He is worried about a blood clot. CP and SOB intermittently x 1 month as well, but denies CP and SOB today. Pt ambulatory, moves all extremities well.

## 2016-06-30 NOTE — Care Management (Signed)
CM consulted to assist patient with follow up care. Patient is noted to be without PCP or Health Insurance. CM met with patient he reports to have [employer] from his employer, but does not have a PCP. CM explained how to find a PCP within his health coverage's network. CM instructed patient to contact his insurance company in the am concerning arranging an follow up appointment.  Patient verbalized understanding and teach back done.  CM will follow up with patient tomorrow concerning arranging establishing primary care and follow up. Updated Dr. Alice Rieger care transition planning he was agreeable.

## 2016-06-30 NOTE — ED Provider Notes (Signed)
[facility]-EMERGENCY DEPT Provider Note   CSN: [id number] Arrival date & time: 06/30/16  1517     History   Chief Complaint Chief Complaint  Patient presents with  . Leg Pain    possible blood clot    HPI Frank Landry is a 56 y.o. male.Patient reports right calf pain for the past 2 weeks. Nothing makes pain better or worse. No treatment prior to coming here. He also had stinging chest pain anterior while at work today which lasted 30 minutes and resolve spontaneously. He's never had similar pain before. No shortness of breath nausea or sweatiness. No treatment prior to coming here. No other associated symptoms. Patient had noninvasive Doppler study of right lower extremity today prior to my exam which showed trace wall thickening versus minor nonocclusive thrombosis in the superficial vein of the posterior calf HPI  Past Medical History:  Diagnosis Date  . No pertinent past medical history     Patient Active Problem List   Diagnosis Date Noted  . Hyponatremia 02/26/2012  . Impaired fasting glucose 02/26/2012  . Adverse drug reaction to Zosyn (rash) 02/26/2012  . Cellulitis 02/25/2012    Past Surgical History:  Procedure Laterality Date  . HERNIA REPAIR    . NO PAST SURGERIES         Home Medications    Prior to Admission medications   Medication Sig Start Date End Date Taking? Authorizing Provider  cephALEXin (KEFLEX) 500 MG capsule Take 1 capsule (500 mg total) by mouth 4 (four) times daily. 10/08/15   Emi Holes, PA-C  cetirizine (ZYRTEC ALLERGY) 10 MG tablet Take 1 tablet (10 mg total) by mouth daily. 10/08/15   Emi Holes, PA-C  diphenhydrAMINE (BENADRYL) 25 mg capsule Take 1 capsule (25 mg total) by mouth every 6 (six) hours as needed. Patient taking differently: Take 25 mg by mouth every 6 (six) hours as needed for itching or allergies.  10/08/15   Emi Holes, PA-C  polyethylene glycol (MIRALAX / GLYCOLAX) packet Take 17 g by mouth 2 (two) times  daily as needed. 10/12/15   Raeford Razor, MD    Family History Family History  Problem Relation Age of Onset  . Diabetes Mother   . Cancer Father     Social History Social History  Substance Use Topics  . Smoking status: Current Some Day Smoker    Packs/day: 0.25    Years: 38.00    Types: Cigarettes  . Smokeless tobacco: Never Used  . Alcohol use Yes     Comment: occasionally     Allergies   Zosyn [piperacillin sod-tazobactam so]   Review of Systems Review of Systems  Cardiovascular: Positive for chest pain.  Musculoskeletal: Positive for myalgias.       Right calf pain  All other systems reviewed and are negative.    Physical Exam Updated Vital Signs BP 140/83 (BP Location: Right Arm)   Pulse 65   Temp 98 F (36.7 C) (Oral)   Resp 18   Ht 6\' 1"  (1.854 m)   Wt 244 lb (110.7 kg)   SpO2 98%   BMI 32.19 kg/m   Physical Exam  Constitutional: He appears well-developed and well-nourished.  HENT:  Head: Normocephalic and atraumatic.  Eyes: Conjunctivae are normal. Pupils are equal, round, and reactive to light.  Neck: Neck supple. No tracheal deviation present. No thyromegaly present.  Cardiovascular: Normal rate and regular rhythm.   No murmur heard. Pulmonary/Chest: Effort normal and breath sounds normal.  Abdominal: Soft. Bowel sounds are normal. He exhibits no distension. There is no tenderness.  Musculoskeletal: Normal range of motion. He exhibits no edema or tenderness.  Right lower extremity tender calf. No redness. DP pulses 2+ bilaterally  Neurological: He is alert. Coordination normal.  Skin: Skin is warm and dry. No rash noted.  Chronic brawny changes of bilateral lower extremities below the knees, otherwise without rash  Psychiatric: He has a normal mood and affect.  Nursing note and vitals reviewed.    ED Treatments / Results  Labs (all labs ordered are listed, but only abnormal results are displayed) Labs Reviewed  D-DIMER, QUANTITATIVE  (NOT AT [facility])  CBC WITH DIFFERENTIAL/PLATELET  Rosezena SensorI-STAT TROPOININ, ED    EKG  EKG Interpretation  Date/Time:  Monday June 30 2016 22:12:36 EST Ventricular Rate:  57 PR Interval:    QRS Duration: 109 QT Interval:  425 QTC Calculation: 414 R Axis:   -35 Text Interpretation:  Sinus rhythm Ventricular premature complex Abnormal R-wave progression, late transition Left ventricular hypertrophy Nonspecific T abnormalities, inferior leads Borderline ST elevation, lateral leads No old tracing to compare Confirmed by Ethelda ChickJACUBOWITZ  MD, Lenola Lockner [phone]([id number]) on 06/30/2016 11:18:32 PM       Radiology No results found.  Procedures Procedures (including critical care time)  Medications Ordered in ED Medications - No data to display   Initial Impression / Assessment and Plan / ED Course  I have reviewed the triage vital signs and the nursing notes.  Pertinent labs & imaging results that were available during my care of the patient were reviewed by me and considered in my medical decision making (see chart for details).     Chest pain is felt to be nonspecific. Heart score equals 3 Advil as directed for pain. Patient is instructed to call his insurance company to get up primary care physician to be seen within the next 1-2 weeks. He is advised that he may need further outpatient testing for his heart such as a stress test and possible reevaluation of his right lower extremity with repeat Doppler study and I counseled patient for 5 minutes on smoking cessation Final Clinical Impressions(s) / ED Diagnoses  Dx #1 right leg pain #2 atypical chest pain #3 tobacco abuse Final diagnoses:  None    New Prescriptions New Prescriptions   No medications on file     Doug SouSam Abdulahi Schor, MD 06/30/16 2337

## 2016-06-30 NOTE — Progress Notes (Signed)
*  PRELIMINARY RESULTS* Vascular Ultrasound Right lower extremity venous duplex has been completed.  Preliminary findings: No evidence of deep vein thrombosis or baker's cysts in the right lower extremity.  Trace wall thickening vs minor non occlusive thrombosis in the superficial vein of the posterior calf.   Chauncey FischerCharlotte C Dontell Mian 06/30/2016, 6:09 PM

## 2016-06-30 NOTE — Discharge Instructions (Signed)
Take Advil as directed for pain. Contact your insurance company tomorrow to get assigned a primary care physician. You should see a primary care physician within the next one or 2 weeks. Ask your new primary care physician to reorder a Doppler study of your right leg to recheck for blood clots. You may have the beginnings of a blood clot in the superficial vein of your right leg which is not dangerous. You should also have a stress test ordered to check your heart out further. Ask your new primary care physician to help you to stop smoking. Return if your condition worsens for any reason.

## 2016-08-06 ENCOUNTER — Encounter (HOSPITAL_COMMUNITY): Payer: Self-pay | Admitting: Emergency Medicine

## 2016-08-06 ENCOUNTER — Ambulatory Visit (HOSPITAL_COMMUNITY)
Admission: EM | Admit: 2016-08-06 | Discharge: 2016-08-06 | Disposition: A | Discharge status: Home / Self Care | Payer: PRIVATE HEALTH INSURANCE | Attending: Family Medicine | Admitting: Family Medicine

## 2016-08-06 DIAGNOSIS — L03115 Cellulitis of right lower limb: Secondary | ICD-10-CM | POA: Diagnosis not present

## 2016-08-06 DIAGNOSIS — L02415 Cutaneous abscess of right lower limb: Secondary | ICD-10-CM

## 2016-08-06 MED ORDER — CEFTRIAXONE SODIUM 1 G IJ SOLR
INTRAMUSCULAR | Status: AC
  Filled 2016-08-06: qty 10

## 2016-08-06 MED ORDER — LIDOCAINE HCL (PF) 1 % IJ SOLN
INTRAMUSCULAR | Status: AC
  Filled 2016-08-06: qty 2

## 2016-08-06 MED ORDER — SULFAMETHOXAZOLE-TRIMETHOPRIM 800-160 MG PO TABS: 1.0000 | ORAL_TABLET | Freq: Two times a day (BID) | ORAL | 0 refills | Status: AC

## 2016-08-06 MED ORDER — CEFTRIAXONE SODIUM 1 G IJ SOLR
1.0000 g | Freq: Once | INTRAMUSCULAR | Status: AC
  Administered 2016-08-06: 1 g via INTRAMUSCULAR

## 2016-08-06 NOTE — ED Triage Notes (Addendum)
Pt states he has had a "rash" on both legs and swelling in the right leg for two months.  He states he was seen in an ED two months ago where they did a venous doppler and found no clot.  Pt has some redness in his right calf.

## 2016-08-06 NOTE — ED Provider Notes (Signed)
CSN: [id number]     Arrival date & time 08/06/16  1004 History   First MD Initiated Contact with Patient 08/06/16 1049     Chief Complaint  Patient presents with  . Leg Swelling   (Consider location/radiation/quality/duration/timing/severity/associated sxs/prior Treatment) Patient c/o right lower extremity pain and erythema.  He has hx of cellulitis and has been hospitalized and put on IV abx's.  He states this started a month ago and went to the ED and was told he did not have DVT.  He denies any cp or sob.     The history is provided by the patient.  Extremity Pain  This is a new problem. The current episode started more than 1 week ago. The problem occurs constantly. The problem has not changed since onset.Associated symptoms include chest pain. Nothing aggravates the symptoms. Nothing relieves the symptoms. He has tried nothing for the symptoms.    Past Medical History:  Diagnosis Date  . No pertinent past medical history    Past Surgical History:  Procedure Laterality Date  . HERNIA REPAIR    . NO PAST SURGERIES     Family History  Problem Relation Age of Onset  . Diabetes Mother   . Cancer Father    Social History  Substance Use Topics  . Smoking status: Current Some Day Smoker    Packs/day: 0.25    Years: 38.00    Types: Cigarettes  . Smokeless tobacco: Never Used  . Alcohol use No    Review of Systems  Constitutional: Negative.   HENT: Negative.   Eyes: Negative.   Respiratory: Negative.   Cardiovascular: Positive for chest pain.  Gastrointestinal: Negative.   Endocrine: Negative.   Genitourinary: Negative.   Musculoskeletal: Negative.   Skin: Positive for rash.  Allergic/Immunologic: Negative.   Neurological: Negative.   Hematological: Negative.   Psychiatric/Behavioral: Negative.     Allergies  Zosyn [piperacillin sod-tazobactam so]  Home Medications   Prior to Admission medications   Medication Sig Start Date End Date Taking? Authorizing  Provider  Emollient (DERMA SOOTHE EX) Apply 1 application topically daily as needed (dry skin).    Historical Provider, MD  GARLIC PO Take 1 tablet by mouth daily.    Historical Provider, MD  sulfamethoxazole-trimethoprim (BACTRIM DS,SEPTRA DS) 800-160 MG tablet Take 1 tablet by mouth 2 (two) times daily. 08/06/16 08/13/16  Deatra CanterWilliam J Kevontay Burks, FNP   Meds Ordered and Administered this Visit   Medications  cefTRIAXone (ROCEPHIN) injection 1 g (1 g Intramuscular Given 08/06/16 1118)    BP 150/94 (BP Location: Left Arm)   Pulse 86   Temp 98.6 F (37 C) (Oral)   SpO2 100%  No data found.   Physical Exam  Constitutional: He appears well-developed and well-nourished.  HENT:  Head: Normocephalic and atraumatic.  Eyes: Conjunctivae and EOM are normal. Pupils are equal, round, and reactive to light.  Neck: Normal range of motion. Neck supple.  Cardiovascular: Normal rate, regular rhythm and normal heart sounds.   Pulmonary/Chest: Effort normal and breath sounds normal.  Skin:  Erythema and discomfort right lower extremity.  Negative homans.  Venous stasis dermatitis.    Nursing note and vitals reviewed.   Urgent Care Course     Procedures (including critical care time)  Labs Review Labs Reviewed - No data to display  Imaging Review No results found.   Visual Acuity Review  Right Eye Distance:   Left Eye Distance:   Bilateral Distance:    Right Eye Near:  Left Eye Near:    Bilateral Near:         MDM   1. Cellulitis and abscess of right leg    Advised patient will try and tx outpatient and if not improving in next few days go to ED.  Rocephin 1 gram IM Bactrim DS one po bid x 10 days #20  Note out of work x 3 days.      Deatra Canter, FNP 08/06/16 1133

## 2016-12-24 ENCOUNTER — Encounter (INDEPENDENT_AMBULATORY_CARE_PROVIDER_SITE_OTHER): Payer: Self-pay | Admitting: Physician Assistant

## 2016-12-24 ENCOUNTER — Ambulatory Visit (INDEPENDENT_AMBULATORY_CARE_PROVIDER_SITE_OTHER): Payer: Self-pay | Admitting: Physician Assistant

## 2016-12-24 VITALS — BP 135/91 | HR 91 | Temp 98.0°F | Ht 71.26 in | Wt 235.4 lb

## 2016-12-24 DIAGNOSIS — M25571 Pain in right ankle and joints of right foot: Secondary | ICD-10-CM

## 2016-12-24 DIAGNOSIS — I739 Peripheral vascular disease, unspecified: Secondary | ICD-10-CM

## 2016-12-24 DIAGNOSIS — B351 Tinea unguium: Secondary | ICD-10-CM

## 2016-12-24 MED ORDER — NAPROXEN 500 MG PO TABS: 500.0000 mg | ORAL_TABLET | Freq: Two times a day (BID) | ORAL | 0 refills | Status: DC

## 2016-12-24 NOTE — Progress Notes (Signed)
  Subjective:  Patient ID: Frank Landry, male    DOB: Dec 30, 1960  Age: 56 y.o. MRN: [id number]  CC: right ankle pain  HPI Frank DeerChristopher Jerome Yeargan is a 56 y.o. male with PMH of alcoholism and RLE cellulitis presents with lateral right ankle pain. Thinks he is having and infection. Ankle is swollen but says the ankle is at baseline. No associated erythema, ecchymosis, trauma, f/c/n/v, or limited aROM. Does not know if he ever had gout as he has never been tested/treated.     Complains of generalized hyperpigmentation and tingling sensation in the soles. Was told by a previous provider that his smoking caused these signs/symptoms.        ROS Review of Systems  Constitutional: Negative for chills, fever and malaise/fatigue.  Eyes: Negative for blurred vision.  Respiratory: Negative for shortness of breath.   Cardiovascular: Negative for chest pain and palpitations.  Gastrointestinal: Negative for abdominal pain and nausea.  Genitourinary: Negative for dysuria and hematuria.  Musculoskeletal: Positive for joint pain. Negative for myalgias.  Skin: Negative for rash.  Neurological: Negative for tingling and headaches.  Psychiatric/Behavioral: Negative for depression. The patient is not nervous/anxious.     Objective:  BP (!) 135/91 (BP Location: Left Arm, Patient Position: Sitting, Cuff Size: Normal)   Pulse 91   Temp 98 F (36.7 C) (Oral)   Ht 5' 11.26" (1.81 m)   Wt 235 lb 6.4 oz (106.8 kg)   SpO2 95%   BMI 32.59 kg/m   BP/Weight 12/24/2016 08/06/2016 06/30/2016  Systolic BP 135 150 143  Diastolic BP 91 94 94  Wt. (Lbs) 235.4 - 244  BMI 32.59 - 32.19      Physical Exam  Constitutional: He is oriented to person, place, and time.  Well developed, obese, NAD, polite  HENT:  Head: Normocephalic and atraumatic.  Eyes: No scleral icterus.  Neck: Normal range of motion. Neck supple. No thyromegaly present.  Cardiovascular: Normal rate, regular rhythm and normal heart  sounds.   Pulmonary/Chest: Effort normal and breath sounds normal.  Musculoskeletal: He exhibits no edema.  Right ankle with bony hypertrophy, no edema, no erythema, ecchymosis, increased warmth, no overlying skin lesions, no TTP. Right calf normal.  Neurological: He is alert and oriented to person, place, and time. No cranial nerve deficit. Coordination normal.  Gait normal.  Skin: Skin is warm and dry. No rash noted. No erythema. No pallor.  Thick, yellow, and dystrophic toe nails.  Psychiatric: He has a normal mood and affect. His behavior is normal. Thought content normal.  Vitals reviewed.    Assessment & Plan:   1. Right ankle pain, unspecified chronicity - Uric Acid - Comprehensive metabolic panel - CBC with Differential  2. PVD (peripheral vascular disease) (HCC) - Comprehensive metabolic panel - CBC with Differential  3. Onychomycosis - Will need to review labs before prescribing Terbinafine   Meds ordered this encounter  Medications  . naproxen (NAPROSYN) 500 MG tablet    Sig: Take 1 tablet (500 mg total) by mouth 2 (two) times daily with a meal.    Dispense:  30 tablet    Refill:  0    Order Specific Question:   Supervising Provider    Answer:   Quentin AngstJEGEDE, OLUGBEMIGA E [[id number]]    Follow-up: Return in about 2 weeks (around 01/07/2017) for full physical .   Loletta Specteroger David Gomez PA

## 2016-12-24 NOTE — Patient Instructions (Signed)
Peripheral Vascular Disease Peripheral vascular disease (PVD) is a disease of the blood vessels that are not part of your heart and brain. A simple term for PVD is poor circulation. In most cases, PVD narrows the blood vessels that carry blood from your heart to the rest of your body. This can result in a decreased supply of blood to your arms, legs, and internal organs, like your stomach or kidneys. However, it most often affects a person's lower legs and feet. There are two types of PVD.  Organic PVD. This is the more common type. It is caused by damage to the structure of blood vessels.  Functional PVD. This is caused by conditions that make blood vessels contract and tighten (spasm).  Without treatment, PVD tends to get worse over time. PVD can also lead to acute ischemic limb. This is when an arm or limb suddenly has trouble getting enough blood. This is a medical emergency. What are the causes? Each type of PVD has many different causes. The most common cause of PVD is buildup of a fatty material (plaque) inside of your arteries (atherosclerosis). Small amounts of plaque can break off from the walls of the blood vessels and become lodged in a smaller artery. This blocks blood flow and can cause acute ischemic limb. Other common causes of PVD include:  Blood clots that form inside of blood vessels.  Injuries to blood vessels.  Diseases that cause inflammation of blood vessels or cause blood vessel spasms.  Health behaviors and health history that increase your risk of developing PVD.  What increases the risk? You may have a greater risk of PVD if you:  Have a family history of PVD.  Have certain medical conditions, including: ? High cholesterol. ? Diabetes. ? High blood pressure (hypertension). ? Coronary heart disease. ? Past problems with blood clots. ? Past injury, such as burns or a broken bone. These may have damaged blood vessels in your limbs. ? Buerger disease. This is  caused by inflamed blood vessels in your hands and feet. ? Some forms of arthritis. ? Rare birth defects that affect the arteries in your legs.  Use tobacco.  Do not get enough exercise.  Are obese.  Are age 50 or older.  What are the signs or symptoms? PVD may cause many different symptoms. Your symptoms depend on what part of your body is not getting enough blood. Some common signs and symptoms include:  Cramps in your lower legs. This may be a symptom of poor leg circulation (claudication).  Pain and weakness in your legs while you are physically active that goes away when you rest (intermittent claudication).  Leg pain when at rest.  Leg numbness, tingling, or weakness.  Coldness in a leg or foot, especially when compared with the other leg.  Skin or hair changes. These can include: ? Hair loss. ? Shiny skin. ? Pale or bluish skin. ? Thick toenails.  Inability to get or maintain an erection (erectile dysfunction).  People with PVD are more prone to developing ulcers and sores on their toes, feet, or legs. These may take longer than normal to heal. How is this diagnosed? Your health care provider may diagnose PVD from your signs and symptoms. The health care provider will also do a physical exam. You may have tests to find out what is causing your PVD and determine its severity. Tests may include:  Blood pressure recordings from your arms and legs and measurements of the strength of your pulses (  pulse volume recordings).  Imaging studies using sound waves to take pictures of the blood flow through your blood vessels (Doppler ultrasound).  Injecting a dye into your blood vessels before having imaging studies using: ? X-rays (angiogram or arteriogram). ? Computer-generated X-rays (CT angiogram). ? A powerful electromagnetic field and a computer (magnetic resonance angiogram or MRA).  How is this treated? Treatment for PVD depends on the cause of your condition and the  severity of your symptoms. It also depends on your age. Underlying causes need to be treated and controlled. These include long-lasting (chronic) conditions, such as diabetes, high cholesterol, and high blood pressure. You may need to first try making lifestyle changes and taking medicines. Surgery may be needed if these do not work. Lifestyle changes may include:  Quitting smoking.  Exercising regularly.  Following a low-fat, low-cholesterol diet.  Medicines may include:  Blood thinners to prevent blood clots.  Medicines to improve blood flow.  Medicines to improve your blood cholesterol levels.  Surgical procedures may include:  A procedure that uses an inflated balloon to open a blocked artery and improve blood flow (angioplasty).  A procedure to put in a tube (stent) to keep a blocked artery open (stent implant).  Surgery to reroute blood flow around a blocked artery (peripheral bypass surgery).  Surgery to remove dead tissue from an infected wound on the affected limb.  Amputation. This is surgical removal of the affected limb. This may be necessary in cases of acute ischemic limb that are not improved through medical or surgical treatments.  Follow these instructions at home:  Take medicines only as directed by your health care provider.  Do not use any tobacco products, including cigarettes, chewing tobacco, or electronic cigarettes. If you need help quitting, ask your health care provider.  Lose weight if you are overweight, and maintain a healthy weight as directed by your health care provider.  Eat a diet that is low in fat and cholesterol. If you need help, ask your health care provider.  Exercise regularly. Ask your health care provider to suggest some good activities for you.  Use compression stockings or other mechanical devices as directed by your health care provider.  Take good care of your feet. ? Wear comfortable shoes that fit well. ? Check your feet  often for any cuts or sores. Contact a health care provider if:  You have cramps in your legs while walking.  You have leg pain when you are at rest.  You have coldness in a leg or foot.  Your skin changes.  You have erectile dysfunction.  You have cuts or sores on your feet that are not healing. Get help right away if:  Your arm or leg turns cold and blue.  Your arms or legs become red, warm, swollen, painful, or numb.  You have chest pain or trouble breathing.  You suddenly have weakness in your face, arm, or leg.  You become very confused or lose the ability to speak.  You suddenly have a very bad headache or lose your vision. This information is not intended to replace advice given to you by your health care provider. Make sure you discuss any questions you have with your health care provider. Document Released: 06/26/2004 Document Revised: 10/25/2015 Document Reviewed: 10/27/2013 [employer] Interactive Patient Education  2017 [employer]  

## 2016-12-25 LAB — COMPREHENSIVE METABOLIC PANEL
A/G RATIO: 1.4 (ref 1.2–2.2)
ALBUMIN: 4.3 g/dL (ref 3.5–5.5)
ALK PHOS: 74 IU/L (ref 39–117)
ALT: 16 IU/L (ref 0–44)
AST: 23 IU/L (ref 0–40)
BUN / CREAT RATIO: 13 (ref 9–20)
BUN: 12 mg/dL (ref 6–24)
Bilirubin Total: 0.3 mg/dL (ref 0.0–1.2)
CO2: 23 mmol/L (ref 20–29)
Calcium: 9.8 mg/dL (ref 8.7–10.2)
Chloride: 103 mmol/L (ref 96–106)
Creatinine, Ser: 0.96 mg/dL (ref 0.76–1.27)
GFR calc Af Amer: 100 mL/min/{1.73_m2} (ref >59)
GFR calc non Af Amer: 87 mL/min/{1.73_m2} (ref >59)
GLOBULIN, TOTAL: 3 g/dL (ref 1.5–4.5)
Glucose: 177 mg/dL — ABNORMAL HIGH (ref 65–99)
POTASSIUM: 4.2 mmol/L (ref 3.5–5.2)
Sodium: 141 mmol/L (ref 134–144)
Total Protein: 7.3 g/dL (ref 6.0–8.5)

## 2016-12-25 LAB — CBC WITH DIFFERENTIAL/PLATELET
BASOS: 1 %
Basophils Absolute: 0 10*3/uL (ref 0.0–0.2)
EOS (ABSOLUTE): 0.2 10*3/uL (ref 0.0–0.4)
Eos: 4 %
HEMATOCRIT: 40.3 % (ref 37.5–51.0)
Hemoglobin: 13.6 g/dL (ref 13.0–17.7)
Immature Grans (Abs): 0 10*3/uL (ref 0.0–0.1)
Immature Granulocytes: 0 %
LYMPHS ABS: 2 10*3/uL (ref 0.7–3.1)
Lymphs: 41 %
MCH: 31.8 pg (ref 26.6–33.0)
MCHC: 33.7 g/dL (ref 31.5–35.7)
MCV: 94 fL (ref 79–97)
MONOS ABS: 0.4 10*3/uL (ref 0.1–0.9)
Monocytes: 7 %
NEUTROS PCT: 47 %
Neutrophils Absolute: 2.2 10*3/uL (ref 1.4–7.0)
PLATELETS: 224 10*3/uL (ref 150–379)
RBC: 4.28 x10E6/uL (ref 4.14–5.80)
RDW: 13.8 % (ref 12.3–15.4)
WBC: 4.8 10*3/uL (ref 3.4–10.8)

## 2016-12-25 LAB — URIC ACID: Uric Acid: 5.2 mg/dL (ref 3.7–8.6)

## 2017-01-14 ENCOUNTER — Encounter (INDEPENDENT_AMBULATORY_CARE_PROVIDER_SITE_OTHER): Payer: Self-pay | Admitting: Physician Assistant

## 2017-01-14 ENCOUNTER — Ambulatory Visit (INDEPENDENT_AMBULATORY_CARE_PROVIDER_SITE_OTHER): Payer: PRIVATE HEALTH INSURANCE | Admitting: Physician Assistant

## 2017-01-14 VITALS — BP 143/86 | HR 83 | Temp 97.9°F | Ht 71.65 in | Wt 235.2 lb

## 2017-01-14 DIAGNOSIS — Z125 Encounter for screening for malignant neoplasm of prostate: Secondary | ICD-10-CM

## 2017-01-14 DIAGNOSIS — M25562 Pain in left knee: Secondary | ICD-10-CM | POA: Diagnosis not present

## 2017-01-14 DIAGNOSIS — M25561 Pain in right knee: Secondary | ICD-10-CM | POA: Diagnosis not present

## 2017-01-14 DIAGNOSIS — Z1159 Encounter for screening for other viral diseases: Secondary | ICD-10-CM | POA: Diagnosis not present

## 2017-01-14 DIAGNOSIS — N5089 Other specified disorders of the male genital organs: Secondary | ICD-10-CM

## 2017-01-14 DIAGNOSIS — Z0000 Encounter for general adult medical examination without abnormal findings: Secondary | ICD-10-CM | POA: Diagnosis not present

## 2017-01-14 DIAGNOSIS — Z114 Encounter for screening for human immunodeficiency virus [HIV]: Secondary | ICD-10-CM

## 2017-01-14 DIAGNOSIS — G8929 Other chronic pain: Secondary | ICD-10-CM | POA: Diagnosis not present

## 2017-01-14 NOTE — Progress Notes (Signed)
Subjective:  Patient ID: Frank Landry, male    DOB: [birth year]  Age: 56 y.o. MRN: [id number]  CC: annual exam   HPI Frank Landry is a 56 y.o. male with a medical hx of chronic bilateral knee pain presents for an annual physical. Did not know Naproxen was prescribed at last visit and therefore did not fill prescription. Feels that knees "lock" in place at times with severe concomitant pain. There is also occasional urinary hesitancy. No dysuria, urinary frequency, hematuria, perineal pain, or LBP. Patient does not endorse any other symptoms or complaints.    Outpatient Medications Prior to Visit  Medication Sig Dispense Refill  . Emollient (DERMA SOOTHE EX) Apply 1 application topically daily as needed (dry skin).    Marland Kitchen GARLIC PO Take 1 tablet by mouth daily.    . naproxen (NAPROSYN) 500 MG tablet Take 1 tablet (500 mg total) by mouth 2 (two) times daily with a meal. 30 tablet 0   No facility-administered medications prior to visit.      ROS Review of Systems  Constitutional: Negative for chills, fever and malaise/fatigue.  Eyes: Negative for blurred vision.  Respiratory: Negative for shortness of breath.   Cardiovascular: Negative for chest pain and palpitations.  Gastrointestinal: Negative for abdominal pain and nausea.  Genitourinary: Negative for dysuria and hematuria.       Occasional urinary hesitancy   Musculoskeletal: Positive for joint pain. Negative for myalgias.  Skin: Negative for rash.  Neurological: Negative for tingling and headaches.  Psychiatric/Behavioral: Negative for depression. The patient is not nervous/anxious.     Objective:  BP (!) 143/86 (BP Location: Left Arm, Patient Position: Sitting, Cuff Size: Large)   Pulse 83   Temp 97.9 F (36.6 C) (Oral)   Ht 5' 11.65" (1.82 m)   Wt 235 lb 3.2 oz (106.7 kg)   SpO2 97%   BMI 32.21 kg/m   BP/Weight 01/14/2017 12/24/2016 08/06/2016  Systolic BP 143 135 150  Diastolic BP 86 91 94   Wt. (Lbs) 235.2 235.4 -  BMI 32.21 32.59 -      Physical Exam  Constitutional: He is oriented to person, place, and time.  Well developed, well nourished, NAD, polite  HENT:  Head: Normocephalic and atraumatic.  Eyes: Pupils are equal, round, and reactive to light. Conjunctivae and EOM are normal. No scleral icterus.  Neck: Normal range of motion. Neck supple. No thyromegaly present.  Cardiovascular: Normal rate, regular rhythm and normal heart sounds.   Pulmonary/Chest: Effort normal and breath sounds normal.  Abdominal: Soft. Bowel sounds are normal. He exhibits no distension. There is no tenderness.  Mild hepatomegaly  Genitourinary:  Genitourinary Comments: Penis normal. Right testicle normal. Left testicle overly firm and with a small posterior nodule.  Musculoskeletal: He exhibits no edema.  LEs, UEs, and back with full aROM.  Lymphadenopathy:    He has no cervical adenopathy.  Neurological: He is alert and oriented to person, place, and time. No cranial nerve deficit. Coordination normal.  Skin: Skin is warm and dry. No rash noted. No erythema. No pallor.  Psychiatric: He has a normal mood and affect. His behavior is normal. Thought content normal.  Vitals reviewed.    Assessment & Plan:    1. Annual physical exam - Lipid Panel; Future  2. Screening for prostate cancer - Fecal occult blood, imunochemical - PSA; Future  3. Need for hepatitis C screening test - Hepatitis c antibody (reflex); Future  4. Encounter for screening for HIV -  HIV antibody; Future  5. Chronic pain of both knees - DG Knee Complete 4 Views Left; Future - DG Knee Complete 4 Views Right; Future  6. Testicular nodule - US Scrotum; Future - US Art/Ven Flow Abd Pelv Doppler; Future     Follow-up: Return in about 4 weeks (around 02/11/2017) for knee pain.   Loletta Specteroger David Gomez PA

## 2017-01-14 NOTE — Patient Instructions (Signed)
Prostate-Specific Antigen Test Why am I having this test? The prostate-specific antigen (PSA) test is performed to determine how much PSA you have in your blood. PSA is a type of protein that is normally present in the prostate gland. Certain conditions can cause PSA blood levels to increase, such as:  Infection in the prostate (prostatitis).  Enlargement of the prostate (hypertrophy).  Prostate cancer.  Because PSA levels increase greatly from prostate cancer, this test can be used to confirm a diagnosis of prostate cancer. It may also be used to monitor treatment for prostate cancer and to watch for a return of prostate cancer after treatment has finished. This test has a very high false-positive rate. Therefore, routine PSA screening for all men is no longer recommended. A false-positive result is incorrect because it indicates a condition or finding is present when it is not. What kind of sample is taken? A blood sample is required for this test. It is usually collected by inserting a needle into a vein or by sticking a finger with a small needle. How do I prepare for this test? There is no preparation required for this test. However, there are factors that can affect the results of a PSA test. To get the most accurate results:  Avoid having a rectal exam within several hours before having your blood drawn for this test.  Avoid having any procedures performed on the prostate gland within 6 weeks of having this test.  Avoid ejaculating within 24 hours of having this test.  Tell your health care provider if you had a recent urinary tract infection (UTI).  Tell your health care provider if you are taking medicines to assist with hair growth, such as finasteride.  Tell your health care provider if you have been exposed to a medicine called diethylstilbestrol.  Let your health care provider know if any of these factors apply to you. You may be asked to reschedule the test. What are the  reference ranges? Reference ranges are established after testing a large group of people. Reference ranges may vary among different people, labs, and hospitals. It is your responsibility to obtain your test results. Ask the lab or department performing the test when and how you will get your results.  Low: 0-2.5 ng/mL.  Slightly to moderately elevated: 2.6-10.0 ng/mL.  Moderately elevated: 10.0-19.9 ng/mL.  Significantly elevated: 20 ng/mL or greater.  What do the results mean? PSA test results greater than 4 ng/mL are found in the majority of men with prostate cancer. If your test result is above this level, this can indicate an increased risk for prostate cancer. Increased PSA levels can also indicate other health conditions. Talk with your health care provider to discuss your results, treatment options, and if necessary, the need for more tests. Talk with your health care provider if you have any questions about your results. Talk with your health care provider to discuss your results, treatment options, and if necessary, the need for more tests. Talk with your health care provider if you have any questions about your results. This information is not intended to replace advice given to you by your health care provider. Make sure you discuss any questions you have with your health care provider. Document Released: 06/21/2004 Document Revised: 01/23/2016 Document Reviewed: 10/12/2013 Elsevier Interactive Patient Education  2018 Elsevier Inc.  

## 2017-01-15 ENCOUNTER — Other Ambulatory Visit (INDEPENDENT_AMBULATORY_CARE_PROVIDER_SITE_OTHER): Payer: PRIVATE HEALTH INSURANCE

## 2017-01-15 DIAGNOSIS — Z0000 Encounter for general adult medical examination without abnormal findings: Secondary | ICD-10-CM

## 2017-01-15 DIAGNOSIS — Z125 Encounter for screening for malignant neoplasm of prostate: Secondary | ICD-10-CM

## 2017-01-15 DIAGNOSIS — Z1159 Encounter for screening for other viral diseases: Secondary | ICD-10-CM

## 2017-01-15 DIAGNOSIS — Z114 Encounter for screening for human immunodeficiency virus [HIV]: Secondary | ICD-10-CM

## 2017-01-16 ENCOUNTER — Other Ambulatory Visit (INDEPENDENT_AMBULATORY_CARE_PROVIDER_SITE_OTHER): Payer: Self-pay | Admitting: Physician Assistant

## 2017-01-16 DIAGNOSIS — E785 Hyperlipidemia, unspecified: Secondary | ICD-10-CM

## 2017-01-16 LAB — LIPID PANEL
CHOL/HDL RATIO: 4.2 ratio (ref 0.0–5.0)
CHOLESTEROL TOTAL: 219 mg/dL — AB (ref 100–199)
HDL: 52 mg/dL (ref >39)
LDL Calculated: 150 mg/dL — ABNORMAL HIGH (ref 0–99)
Triglycerides: 83 mg/dL (ref 0–149)
VLDL Cholesterol Cal: 17 mg/dL (ref 5–40)

## 2017-01-16 LAB — PSA: Prostate Specific Ag, Serum: 1 ng/mL (ref 0.0–4.0)

## 2017-01-16 LAB — HEPATITIS C ANTIBODY (REFLEX)

## 2017-01-16 LAB — HIV ANTIBODY (ROUTINE TESTING W REFLEX): HIV Screen 4th Generation wRfx: NONREACTIVE

## 2017-01-16 LAB — HCV COMMENT:

## 2017-01-16 MED ORDER — LOVASTATIN 20 MG PO TABS: 20.0000 mg | ORAL_TABLET | Freq: Every day | ORAL | 3 refills | Status: DC

## 2017-01-19 ENCOUNTER — Ambulatory Visit (HOSPITAL_COMMUNITY)
Admission: RE | Admit: 2017-01-19 | Discharge: 2017-01-19 | Disposition: A | Discharge status: Home / Self Care | Payer: PRIVATE HEALTH INSURANCE | Source: Ambulatory Visit | Attending: Physician Assistant | Admitting: Physician Assistant

## 2017-01-19 DIAGNOSIS — N5089 Other specified disorders of the male genital organs: Secondary | ICD-10-CM | POA: Diagnosis not present

## 2017-01-20 LAB — FECAL OCCULT BLOOD, IMMUNOCHEMICAL: Fecal Occult Bld: NEGATIVE

## 2017-04-08 ENCOUNTER — Ambulatory Visit (INDEPENDENT_AMBULATORY_CARE_PROVIDER_SITE_OTHER): Payer: PRIVATE HEALTH INSURANCE | Admitting: Physician Assistant

## 2017-04-13 ENCOUNTER — Encounter (INDEPENDENT_AMBULATORY_CARE_PROVIDER_SITE_OTHER): Payer: Self-pay | Admitting: Physician Assistant

## 2017-04-13 ENCOUNTER — Ambulatory Visit (INDEPENDENT_AMBULATORY_CARE_PROVIDER_SITE_OTHER): Payer: PRIVATE HEALTH INSURANCE | Admitting: Physician Assistant

## 2017-04-13 VITALS — BP 120/77 | HR 88 | Temp 98.2°F | Resp 18 | Ht 73.0 in | Wt 238.0 lb

## 2017-04-13 DIAGNOSIS — L0292 Furuncle, unspecified: Secondary | ICD-10-CM | POA: Diagnosis not present

## 2017-04-13 DIAGNOSIS — L853 Xerosis cutis: Secondary | ICD-10-CM

## 2017-04-13 DIAGNOSIS — Z1211 Encounter for screening for malignant neoplasm of colon: Secondary | ICD-10-CM

## 2017-04-13 MED ORDER — HYDROXYZINE HCL 25 MG PO TABS: 25.0000 mg | ORAL_TABLET | Freq: Three times a day (TID) | ORAL | 0 refills | Status: DC | PRN

## 2017-04-13 MED ORDER — CLINDAMYCIN PHOSPHATE 1 % EX GEL: Freq: Two times a day (BID) | CUTANEOUS | 0 refills | Status: DC

## 2017-04-13 MED ORDER — CETAPHIL MOISTURIZING EX CREA: TOPICAL_CREAM | CUTANEOUS | 0 refills | Status: DC | PRN

## 2017-04-13 NOTE — Progress Notes (Signed)
Subjective:  Patient ID: Frank Landry, male    DOB: August 25, 1960  Age: 56 y.o. MRN: [id number]  CC: rash  HPI  Frank Landry is a 56 y.o. male with a medical hx of chronic bilateral knee pain presents with complaint of skin dryness and itching all over his body. Since a few weeks ago. Takes hot steamy showers and does not apply moisturizer afterwards. Used Benadryl with temporary resolution of itching.     Also complains of small boils under the axilla bilaterally. Has not used anything for relief. No limited aROM of UE bilaterally 2/2 pain. No f/c/n/v, streaking, or suppuration.     Outpatient Medications Prior to Visit  Medication Sig Dispense Refill  . Emollient (DERMA SOOTHE EX) Apply 1 application topically daily as needed (dry skin).    Marland Kitchen. GARLIC PO Take 1 tablet by mouth daily.    Marland Kitchen. lovastatin (MEVACOR) 20 MG tablet Take 1 tablet (20 mg total) by mouth at bedtime. 90 tablet 3  . naproxen (NAPROSYN) 500 MG tablet Take 1 tablet (500 mg total) by mouth 2 (two) times daily with a meal. 30 tablet 0   No facility-administered medications prior to visit.      ROS Review of Systems  Constitutional: Negative for chills, fever and malaise/fatigue.  Eyes: Negative for blurred vision.  Respiratory: Negative for shortness of breath.   Cardiovascular: Negative for chest pain and palpitations.  Gastrointestinal: Negative for abdominal pain and nausea.  Genitourinary: Negative for dysuria and hematuria.  Musculoskeletal: Negative for joint pain and myalgias.  Skin: Positive for itching. Negative for rash.       Dry skin  Neurological: Negative for tingling and headaches.  Psychiatric/Behavioral: Negative for depression. The patient is not nervous/anxious.     Objective:  BP 120/77 (BP Location: Left Arm, Patient Position: Sitting, Cuff Size: Large)   Pulse 88   Temp 98.2 F (36.8 C) (Oral)   Resp 18   Ht 6\' 1"  (1.854 m)   Wt 238 lb (108 kg)   SpO2 97%    BMI 31.40 kg/m   BP/Weight 04/13/2017 01/14/2017 12/24/2016  Systolic BP 120 143 135  Diastolic BP 77 86 91  Wt. (Lbs) 238 235.2 235.4  BMI 31.4 32.21 32.59      Physical Exam  Constitutional: He is oriented to person, place, and time.  Well developed, overweight, NAD, polite  HENT:  Head: Normocephalic and atraumatic.  Eyes: No scleral icterus.  Neck: Normal range of motion. Neck supple. No thyromegaly present.  Cardiovascular: Normal rate, regular rhythm and normal heart sounds.  Pulmonary/Chest: Effort normal and breath sounds normal.  Musculoskeletal: He exhibits no edema.  Neurological: He is alert and oriented to person, place, and time. No cranial nerve deficit. Coordination normal.  Skin:  Dry skin of the LE bilaterally, UE bilaterally, upper chest, and back. No scaling, crusting, cracking, bleeding, cellulitis. Few small papulopustular lesions in the axilla bilaterally. No cellulitis, suppuration, bleeding, or induration bilaterally.  Psychiatric: He has a normal mood and affect. His behavior is normal. Thought content normal.  Vitals reviewed.    Assessment & Plan:   1. Dry skin dermatitis - TSH  2. Boils - Begin clindamycin 1% gel  3. Special screening for malignant neoplasms, colon - FIT negative on 01/15/17. Epic did not update automatically. I have updated manually.     Meds ordered this encounter  Medications  . Emollient (CETAPHIL) cream    Sig: Apply as needed topically.  Dispense:  90 g    Refill:  0    Order Specific Question:   Supervising Provider    Answer:   Quentin AngstJEGEDE, OLUGBEMIGA E [id number]  . hydrOXYzine (ATARAX/VISTARIL) 25 MG tablet    Sig: Take 1 tablet (25 mg total) 3 (three) times daily as needed by mouth.    Dispense:  30 tablet    Refill:  0    Order Specific Question:   Supervising Provider    Answer:   Quentin AngstJEGEDE, OLUGBEMIGA E [id number]  . clindamycin (CLINDAGEL) 1 % gel    Sig: Apply 2 (two) times daily topically.    Dispense:  30  g    Refill:  0    Order Specific Question:   Supervising Provider    Answer:   Quentin AngstJEGEDE, OLUGBEMIGA E [[id number]]    Follow-up: PRN  Loletta Specteroger David Niyah Mamaril PA

## 2017-04-13 NOTE — Patient Instructions (Signed)
Skin Abscess A skin abscess is an infected area on or under your skin that contains pus and other material. An abscess can happen almost anywhere on your body. Some abscesses break open (rupture) on their own. Most continue to get worse unless they are treated. The infection can spread deeper into the body and into your blood, which can make you feel sick. Treatment usually involves draining the abscess. Follow these instructions at home: Abscess Care  If you have an abscess that has not drained, place a warm, clean, wet washcloth over the abscess several times a day. Do this as told by your doctor.  Follow instructions from your doctor about how to take care of your abscess. Make sure you: ? Cover the abscess with a bandage (dressing). ? Change your bandage or gauze as told by your doctor. ? Wash your hands with soap and water before you change the bandage or gauze. If you cannot use soap and water, use hand sanitizer.  Check your abscess every day for signs that the infection is getting worse. Check for: ? More redness, swelling, or pain. ? More fluid or blood. ? Warmth. ? More pus or a bad smell. Medicines   Take over-the-counter and prescription medicines only as told by your doctor.  If you were prescribed an antibiotic medicine, take it as told by your doctor. Do not stop taking the antibiotic even if you start to feel better. General instructions  To avoid spreading the infection: ? Do not share personal care items, towels, or hot tubs with others. ? Avoid making skin-to-skin contact with other people.  Keep all follow-up visits as told by your doctor. This is important. Contact a doctor if:  You have more redness, swelling, or pain around your abscess.  You have more fluid or blood coming from your abscess.  Your abscess feels warm when you touch it.  You have more pus or a bad smell coming from your abscess.  You have a fever.  Your muscles ache.  You have  chills.  You feel sick. Get help right away if:  You have very bad (severe) pain.  You see red streaks on your skin spreading away from the abscess. This information is not intended to replace advice given to you by your health care provider. Make sure you discuss any questions you have with your health care provider. Document Released: 11/05/2007 Document Revised: 01/13/2016 Document Reviewed: 03/28/2015 Elsevier Interactive Patient Education  2018 Elsevier Inc.  

## 2017-04-14 LAB — TSH: TSH: 2.31 u[IU]/mL (ref 0.450–4.500)

## 2017-04-16 ENCOUNTER — Telehealth (INDEPENDENT_AMBULATORY_CARE_PROVIDER_SITE_OTHER): Payer: Self-pay

## 2017-04-16 NOTE — Telephone Encounter (Signed)
-----   Message from Loletta Specteroger David Gomez, PA-C sent at 04/14/2017 12:24 PM EST ----- Normal thyroid. This is not the cause of his dry skin. Keep recommendations given to him at his visit.

## 2017-04-16 NOTE — Telephone Encounter (Signed)
Left patient a message informing of normal thyroid and this not likely being the cause of his dry skin, encouraged patient to follow the recommendations given to him at his office visit and to call the office with any questions. Maryjean Mornempestt S Roberts, CMA

## 2017-06-03 ENCOUNTER — Other Ambulatory Visit (INDEPENDENT_AMBULATORY_CARE_PROVIDER_SITE_OTHER): Payer: PRIVATE HEALTH INSURANCE

## 2018-07-26 ENCOUNTER — Emergency Department (HOSPITAL_COMMUNITY)
Admission: EM | Admit: 2018-07-26 | Discharge: 2018-07-26 | Disposition: A | Discharge status: Home / Self Care | Payer: PRIVATE HEALTH INSURANCE | Attending: Emergency Medicine | Admitting: Emergency Medicine

## 2018-07-26 ENCOUNTER — Other Ambulatory Visit: Payer: Self-pay

## 2018-07-26 ENCOUNTER — Encounter (INDEPENDENT_AMBULATORY_CARE_PROVIDER_SITE_OTHER): Payer: Self-pay | Admitting: Primary Care

## 2018-07-26 ENCOUNTER — Ambulatory Visit (INDEPENDENT_AMBULATORY_CARE_PROVIDER_SITE_OTHER): Payer: PRIVATE HEALTH INSURANCE | Admitting: Primary Care

## 2018-07-26 ENCOUNTER — Encounter (HOSPITAL_COMMUNITY): Payer: Self-pay | Admitting: Emergency Medicine

## 2018-07-26 VITALS — BP 110/75 | HR 86 | Temp 97.8°F | Ht 73.0 in | Wt 206.0 lb

## 2018-07-26 DIAGNOSIS — R739 Hyperglycemia, unspecified: Secondary | ICD-10-CM | POA: Diagnosis present

## 2018-07-26 DIAGNOSIS — Z87891 Personal history of nicotine dependence: Secondary | ICD-10-CM | POA: Insufficient documentation

## 2018-07-26 DIAGNOSIS — M549 Dorsalgia, unspecified: Secondary | ICD-10-CM | POA: Diagnosis not present

## 2018-07-26 DIAGNOSIS — R350 Frequency of micturition: Secondary | ICD-10-CM

## 2018-07-26 DIAGNOSIS — E119 Type 2 diabetes mellitus without complications: Secondary | ICD-10-CM

## 2018-07-26 DIAGNOSIS — E1165 Type 2 diabetes mellitus with hyperglycemia: Secondary | ICD-10-CM

## 2018-07-26 LAB — URINALYSIS, ROUTINE W REFLEX MICROSCOPIC
BACTERIA UA: NONE SEEN
Bilirubin Urine: NEGATIVE
Glucose, UA: >=500 mg/dL — AB
HGB URINE DIPSTICK: NEGATIVE
Ketones, ur: 5 mg/dL — AB
Leukocytes,Ua: NEGATIVE
NITRITE: NEGATIVE
Protein, ur: NEGATIVE mg/dL
Specific Gravity, Urine: 1.029 (ref 1.005–1.030)
pH: 5 (ref 5.0–8.0)

## 2018-07-26 LAB — CBC WITH DIFFERENTIAL/PLATELET
Abs Immature Granulocytes: 0.01 10*3/uL (ref 0.00–0.07)
Basophils Absolute: 0 10*3/uL (ref 0.0–0.1)
Basophils Relative: 1 %
Eosinophils Absolute: 0.1 10*3/uL (ref 0.0–0.5)
Eosinophils Relative: 2 %
HCT: 43.4 % (ref 39.0–52.0)
HEMOGLOBIN: 14.3 g/dL (ref 13.0–17.0)
Immature Granulocytes: 0 %
LYMPHS ABS: 2.4 10*3/uL (ref 0.7–4.0)
Lymphocytes Relative: 50 %
MCH: 30.4 pg (ref 26.0–34.0)
MCHC: 32.9 g/dL (ref 30.0–36.0)
MCV: 92.3 fL (ref 80.0–100.0)
Monocytes Absolute: 0.4 10*3/uL (ref 0.1–1.0)
Monocytes Relative: 8 %
NEUTROS PCT: 39 %
Neutro Abs: 1.8 10*3/uL (ref 1.7–7.7)
Platelets: 189 10*3/uL (ref 150–400)
RBC: 4.7 MIL/uL (ref 4.22–5.81)
RDW: 12.5 % (ref 11.5–15.5)
WBC: 4.7 10*3/uL (ref 4.0–10.5)
nRBC: 0 % (ref 0.0–0.2)

## 2018-07-26 LAB — CBG MONITORING, ED
GLUCOSE-CAPILLARY: 323 mg/dL — AB (ref 70–99)
Glucose-Capillary: 243 mg/dL — ABNORMAL HIGH (ref 70–99)

## 2018-07-26 LAB — POCT I-STAT EG7
Acid-Base Excess: 1 mmol/L (ref 0.0–2.0)
BICARBONATE: 27.2 mmol/L (ref 20.0–28.0)
Calcium, Ion: 1.23 mmol/L (ref 1.15–1.40)
HCT: 42 % (ref 39.0–52.0)
Hemoglobin: 14.3 g/dL (ref 13.0–17.0)
O2 Saturation: 52 %
Patient temperature: 37
Potassium: 4.3 mmol/L (ref 3.5–5.1)
Sodium: 135 mmol/L (ref 135–145)
TCO2: 29 mmol/L (ref 22–32)
pCO2, Ven: 47.3 mmHg (ref 44.0–60.0)
pH, Ven: 7.368 (ref 7.250–7.430)
pO2, Ven: 29 mmHg — CL (ref 32.0–45.0)

## 2018-07-26 LAB — COMPREHENSIVE METABOLIC PANEL
ALT: 22 U/L (ref 0–44)
AST: 18 U/L (ref 15–41)
Albumin: 3.8 g/dL (ref 3.5–5.0)
Alkaline Phosphatase: 64 U/L (ref 38–126)
Anion gap: 8 (ref 5–15)
BUN: 13 mg/dL (ref 6–20)
CO2: 25 mmol/L (ref 22–32)
Calcium: 9.5 mg/dL (ref 8.9–10.3)
Chloride: 101 mmol/L (ref 98–111)
Creatinine, Ser: 0.93 mg/dL (ref 0.61–1.24)
GFR calc Af Amer: >60 mL/min (ref >60)
GFR calc non Af Amer: >60 mL/min (ref >60)
Glucose, Bld: 339 mg/dL — ABNORMAL HIGH (ref 70–99)
Potassium: 4.2 mmol/L (ref 3.5–5.1)
Sodium: 134 mmol/L — ABNORMAL LOW (ref 135–145)
Total Bilirubin: 0.8 mg/dL (ref 0.3–1.2)
Total Protein: 6.9 g/dL (ref 6.5–8.1)

## 2018-07-26 LAB — POCT GLYCOSYLATED HEMOGLOBIN (HGB A1C)

## 2018-07-26 LAB — POCT CBG (FASTING - GLUCOSE)-MANUAL ENTRY: Glucose Fasting, POC: 393 mg/dL — AB (ref 70–99)

## 2018-07-26 MED ORDER — METFORMIN HCL 500 MG PO TABS: 500.0000 mg | ORAL_TABLET | Freq: Two times a day (BID) | ORAL | 0 refills | Status: DC

## 2018-07-26 MED ORDER — SODIUM CHLORIDE 0.9 % IV BOLUS
1000.0000 mL | Freq: Once | INTRAVENOUS | Status: AC
  Administered 2018-07-26: 1000 mL via INTRAVENOUS

## 2018-07-26 MED ORDER — INSULIN ASPART 100 UNIT/ML ~~LOC~~ SOLN
5.0000 [IU] | Freq: Once | SUBCUTANEOUS | Status: AC
  Administered 2018-07-26: 5 [IU] via SUBCUTANEOUS

## 2018-07-26 MED ORDER — METFORMIN HCL 500 MG PO TABS
500.0000 mg | ORAL_TABLET | Freq: Once | ORAL | Status: AC
  Administered 2018-07-26: 500 mg via ORAL
  Filled 2018-07-26: qty 1

## 2018-07-26 NOTE — Discharge Instructions (Signed)
Take metformin twice daily   Avoid sugary drinks and try to lose weight   See your doctor in a week to recheck your blood sugar   Return to ER if you have vomiting, fever, abdominal pain.

## 2018-07-26 NOTE — ED Triage Notes (Signed)
Pt. Stated, I was sent here by the office I was at [facility].  And said I needed to come here to address my diabetes which was 393 at their facility

## 2018-07-26 NOTE — Patient Instructions (Signed)
Diabetes Basics    Diabetes (diabetes mellitus) is a long-term (chronic) disease. It occurs when the body does not properly use sugar (glucose) that is released from food after you eat.  Diabetes may be caused by one or both of these problems:  · Your pancreas does not make enough of a hormone called insulin.  · Your body does not react in a normal way to insulin that it makes.  Insulin lets sugars (glucose) go into cells in your body. This gives you energy. If you have diabetes, sugars cannot get into cells. This causes high blood sugar (hyperglycemia).  Follow these instructions at home:  How is diabetes treated?  You may need to take insulin or other diabetes medicines daily to keep your blood sugar in balance. Take your diabetes medicines every day as told by your doctor. List your diabetes medicines here:  Diabetes medicines  · Name of medicine: ______________________________  ? Amount (dose): _______________ Time (a.m./p.m.): _______________ Notes: ___________________________________  · Name of medicine: ______________________________  ? Amount (dose): _______________ Time (a.m./p.m.): _______________ Notes: ___________________________________  · Name of medicine: ______________________________  ? Amount (dose): _______________ Time (a.m./p.m.): _______________ Notes: ___________________________________  If you use insulin, you will learn how to give yourself insulin by injection. You may need to adjust the amount based on the food that you eat. List the types of insulin you use here:  Insulin  · Insulin type: ______________________________  ? Amount (dose): _______________ Time (a.m./p.m.): _______________ Notes: ___________________________________  · Insulin type: ______________________________  ? Amount (dose): _______________ Time (a.m./p.m.): _______________ Notes: ___________________________________  · Insulin type: ______________________________  ? Amount (dose): _______________ Time (a.m./p.m.):  _______________ Notes: ___________________________________  · Insulin type: ______________________________  ? Amount (dose): _______________ Time (a.m./p.m.): _______________ Notes: ___________________________________  · Insulin type: ______________________________  ? Amount (dose): _______________ Time (a.m./p.m.): _______________ Notes: ___________________________________  How do I manage my blood sugar?    Check your blood sugar levels using a blood glucose monitor as directed by your doctor.  Your doctor will set treatment goals for you. Generally, you should have these blood sugar levels:  · Before meals (preprandial): 80-130 mg/dL (4.4-7.2 mmol/L).  · After meals (postprandial): below 180 mg/dL (10 mmol/L).  · A1c level: less than 7%.  Write down the times that you will check your blood sugar levels:  Blood sugar checks  · Time: _______________ Notes: ___________________________________  · Time: _______________ Notes: ___________________________________  · Time: _______________ Notes: ___________________________________  · Time: _______________ Notes: ___________________________________  · Time: _______________ Notes: ___________________________________  · Time: _______________ Notes: ___________________________________    What do I need to know about low blood sugar?  Low blood sugar is called hypoglycemia. This is when blood sugar is at or below 70 mg/dL (3.9 mmol/L). Symptoms may include:  · Feeling:  ? Hungry.  ? Worried or nervous (anxious).  ? Sweaty and clammy.  ? Confused.  ? Dizzy.  ? Sleepy.  ? Sick to your stomach (nauseous).  · Having:  ? A fast heartbeat.  ? A headache.  ? A change in your vision.  ? Tingling or no feeling (numbness) around the mouth, lips, or tongue.  ? Jerky movements that you cannot control (seizure).  · Having trouble with:  ? Moving (coordination).  ? Sleeping.  ? Passing out (fainting).  ? Getting upset easily (irritability).  Treating low blood sugar  To treat low blood  sugar, eat or drink something sugary right away. If you can think clearly and swallow safely, follow the 15:15   rule:  · Take 15 grams of a fast-acting carb (carbohydrate). Talk with your doctor about how much you should take.  · Some fast-acting carbs are:  ? Sugar tablets (glucose pills). Take 3-4 glucose pills.  ? 6-8 pieces of hard candy.  ? 4-6 oz (120-150 mL) of fruit juice.  ? 4-6 oz (120-150 mL) of regular (not diet) soda.  ? 1 Tbsp (15 mL) honey or sugar.  · Check your blood sugar 15 minutes after you take the carb.  · If your blood sugar is still at or below 70 mg/dL (3.9 mmol/L), take 15 grams of a carb again.  · If your blood sugar does not go above 70 mg/dL (3.9 mmol/L) after 3 tries, get help right away.  · After your blood sugar goes back to normal, eat a meal or a snack within 1 hour.  Treating very low blood sugar  If your blood sugar is at or below 54 mg/dL (3 mmol/L), you have very low blood sugar (severe hypoglycemia). This is an emergency. Do not wait to see if the symptoms will go away. Get medical help right away. Call your local emergency services (911 in the [place] Do not drive yourself to the hospital.  Questions to ask your health care provider  · Do I need to meet with a diabetes educator?  · What equipment will I need to care for myself at home?  · What diabetes medicines do I need? When should I take them?  · How often do I need to check my blood sugar?  · What number can I call if I have questions?  · When is my next doctor's visit?  · Where can I find a support group for people with diabetes?  Where to find more information  · American Diabetes Association: www.diabetes.org  · American Association of Diabetes Educators: www.diabeteseducator.org/patient-resources  Contact a doctor if:  · Your blood sugar is at or above 240 mg/dL (13.3 mmol/L) for 2 days in a row.  · You have been sick or have had a fever for 2 days or more, and you are not getting better.  · You have any of these  problems for more than 6 hours:  ? You cannot eat or drink.  ? You feel sick to your stomach (nauseous).  ? You throw up (vomit).  ? You have watery poop (diarrhea).  Get help right away if:  · Your blood sugar is lower than 54 mg/dL (3 mmol/L).  · You get confused.  · You have trouble:  ? Thinking clearly.  ? Breathing.  Summary  · Diabetes (diabetes mellitus) is a long-term (chronic) disease. It occurs when the body does not properly use sugar (glucose) that is released from food after digestion.  · Take insulin and diabetes medicines as told.  · Check your blood sugar every day, as often as told.  · Keep all follow-up visits as told by your doctor. This is important.  This information is not intended to replace advice given to you by your health care provider. Make sure you discuss any questions you have with your health care provider.  Document Released: 08/21/2017 Document Revised: 11/09/2017 Document Reviewed: 08/21/2017  Elsevier Interactive Patient Education © 2019 Elsevier Inc.

## 2018-07-26 NOTE — Progress Notes (Signed)
Established Patient Office Visit  Subjective:  Patient ID: Frank Landry, male    DOB: 04/01/1961  Age: 58 y.o. MRN: [id number]  CC:  Chief Complaint  Patient presents with  . Annual Exam  . Constipation    HPI Frank Landry presents for a physical he was last seen 12/24/16. He endorses hunger and fatigue , frequent urination, dry mouth , thirsty all the time.  States was not aware of having diabetes CBG was 393 and A1C unable to quantify . His hx of noncompliance with follow up appointment treating him with insulin  is a band aid for a greater problem that can be evaluated , tx and managed in the emergency room. Patient stated he would follow up in the ED to have further evaluations on new dx of diabetes.  Past Medical History:  Diagnosis Date  . No pertinent past medical history     Past Surgical History:  Procedure Laterality Date  . HERNIA REPAIR    . NO PAST SURGERIES      Family History  Problem Relation Age of Onset  . Diabetes Mother   . Cancer Father     Social History   Socioeconomic History  . Marital status: Married    Spouse name: Not on file  . Number of children: Not on file  . Years of education: Not on file  . Highest education level: Not on file  Occupational History  . Not on file  Social Needs  . Financial resource strain: Not on file  . Food insecurity:    Worry: Not on file    Inability: Not on file  . Transportation needs:    Medical: Not on file    Non-medical: Not on file  Tobacco Use  . Smoking status: Former Smoker    Packs/day: 0.25    Years: 38.00    Pack years: 9.50    Types: Cigarettes  . Smokeless tobacco: Never Used  . Tobacco comment: quit 4 years ago   Substance and Sexual Activity  . Alcohol use: No  . Drug use: No    Types: Marijuana    Comment: every 2 weeks- Denies 10/08/15  . Sexual activity: Not on file  Lifestyle  . Physical activity:    Days per week: Not on file    Minutes per session:  Not on file  . Stress: Not on file  Relationships  . Social connections:    Talks on phone: Not on file    Gets together: Not on file    Attends religious service: Not on file    Active member of club or organization: Not on file    Attends meetings of clubs or organizations: Not on file    Relationship status: Not on file  . Intimate partner violence:    Fear of current or ex partner: Not on file    Emotionally abused: Not on file    Physically abused: Not on file    Forced sexual activity: Not on file  Other Topics Concern  . Not on file  Social History Narrative  . Not on file    Outpatient Medications Prior to Visit  Medication Sig Dispense Refill  . lovastatin (MEVACOR) 20 MG tablet Take 1 tablet (20 mg total) by mouth at bedtime. (Patient not taking: Reported on 07/26/2018) 90 tablet 3  . clindamycin (CLINDAGEL) 1 % gel Apply 2 (two) times daily topically. 30 g 0  . Emollient (CETAPHIL) cream Apply as needed topically.  90 g 0  . GARLIC PO Take 1 tablet by mouth daily.    . hydrOXYzine (ATARAX/VISTARIL) 25 MG tablet Take 1 tablet (25 mg total) 3 (three) times daily as needed by mouth. 30 tablet 0  . naproxen (NAPROSYN) 500 MG tablet Take 1 tablet (500 mg total) by mouth 2 (two) times daily with a meal. 30 tablet 0   No facility-administered medications prior to visit.     Allergies  Allergen Reactions  . Zosyn [Piperacillin Sod-Tazobactam So] Rash    ROS Review of Systems  Constitutional: Negative.   HENT: Negative.   Eyes: Negative.   Respiratory: Negative.   Cardiovascular: Negative.   Gastrointestinal: Negative.   Endocrine: Positive for polydipsia, polyphagia and polyuria.  Genitourinary: Positive for frequency.  Musculoskeletal: Negative.   Skin: Negative.   Allergic/Immunologic: Negative.   Neurological: Negative.   Hematological: Negative.   Psychiatric/Behavioral: Negative.       Objective:    Physical Exam  Constitutional: He is oriented to  person, place, and time. He appears well-developed.  HENT:  Head: Normocephalic.  Eyes: Pupils are equal, round, and reactive to light. EOM are normal.  Neck: Normal range of motion.  Cardiovascular: Normal rate.  Pulmonary/Chest: Effort normal and breath sounds normal.  Abdominal: Soft. Bowel sounds are normal.  Musculoskeletal: Normal range of motion.  Neurological: He is alert and oriented to person, place, and time.  Skin: Skin is warm and dry.  Psychiatric: He has a normal mood and affect.    BP 110/75 (BP Location: Left Arm, Patient Position: Sitting, Cuff Size: Large)   Pulse 86   Temp 97.8 F (36.6 C) (Oral)   Ht 6\' 1"  (1.854 m)   Wt 206 lb (93.4 kg)   SpO2 96%   BMI 27.18 kg/m  Wt Readings from Last 3 Encounters:  07/26/18 206 lb (93.4 kg)  04/13/17 238 lb (108 kg)  01/14/17 235 lb 3.2 oz (106.7 kg)     Health Maintenance Due  Topic Date Due  . URINE MICROALBUMIN  10/15/1968  . INFLUENZA VACCINE  12/31/2017    There are no preventive care reminders to display for this patient.  Lab Results  Component Value Date   TSH 2.310 04/13/2017   Lab Results  Component Value Date   WBC 4.8 12/24/2016   HGB 13.6 12/24/2016   HCT 40.3 12/24/2016   MCV 94 12/24/2016   PLT 224 12/24/2016   Lab Results  Component Value Date   NA 141 12/24/2016   K 4.2 12/24/2016   CO2 23 12/24/2016   GLUCOSE 177 (H) 12/24/2016   BUN 12 12/24/2016   CREATININE 0.96 12/24/2016   BILITOT 0.3 12/24/2016   ALKPHOS 74 12/24/2016   AST 23 12/24/2016   ALT 16 12/24/2016   PROT 7.3 12/24/2016   ALBUMIN 4.3 12/24/2016   CALCIUM 9.8 12/24/2016   ANIONGAP 6 10/08/2015   Lab Results  Component Value Date   CHOL 219 (H) 01/15/2017   Lab Results  Component Value Date   HDL 52 01/15/2017   Lab Results  Component Value Date   LDLCALC 150 (H) 01/15/2017   Lab Results  Component Value Date   TRIG 83 01/15/2017   Lab Results  Component Value Date   CHOLHDL 4.2 01/15/2017    Lab Results  Component Value Date   HGBA1C  07/26/2018     Comment:     too high to read... greater than 15      Assessment & Plan:  Problem List Items Addressed This Visit    None    Visit Diagnoses    Type 2 diabetes mellitus without complication, without long-term current use of insulin (HCC)    -  Primary   Relevant Orders   HgB A1c (Completed)   Glucose (CBG), Fasting (Completed)   Frequent urination        Patient advised to go to ED for Type 2 diabetes unable to manage at 4pm   Follow-up: Return in about 4 weeks (around 08/23/2018) for teaching and monitoring diabetes .    Grayce Sessions, NP

## 2018-07-26 NOTE — ED Provider Notes (Addendum)
[facility] EMERGENCY DEPARTMENT Provider Note   CSN: [id number] Arrival date & time: 07/26/18  1649    History   Chief Complaint Chief Complaint  Patient presents with  . Hyperglycemia    HPI Frank Landry is a 58 y.o. male here presenting with hyperglycemia.  Patient states that he has been having creased thirstiness as well as increased urination for several weeks.  Patient went to [facility] for annual physical and was noted to be hyperglycemic around 390.  He also has labs drawn and had a hemoglobin A1c that is greater than 15.  Patient denies any fevers or chills or vomiting or abdominal pain.  Patient has no known diagnosis of diabetes but does have family history of diabetes.  Patient states that he has some mild right sided back pain but denies any fall or injury.  Denies any nausea or vomiting.      The history is provided by the patient.    Past Medical History:  Diagnosis Date  . No pertinent past medical history     Patient Active Problem List   Diagnosis Date Noted  . Hyponatremia 02/26/2012  . Impaired fasting glucose 02/26/2012  . Adverse drug reaction to Zosyn (rash) 02/26/2012  . Cellulitis 02/25/2012    Past Surgical History:  Procedure Laterality Date  . HERNIA REPAIR    . NO PAST SURGERIES          Home Medications    Prior to Admission medications   Not on File    Family History Family History  Problem Relation Age of Onset  . Diabetes Mother   . Cancer Father     Social History Social History   Tobacco Use  . Smoking status: Former Smoker    Packs/day: 0.25    Years: 38.00    Pack years: 9.50    Types: Cigarettes  . Smokeless tobacco: Never Used  . Tobacco comment: quit 4 years ago   Substance Use Topics  . Alcohol use: No  . Drug use: No    Types: Marijuana    Comment: every 2 weeks- Denies 10/08/15     Allergies   Zosyn [piperacillin sod-tazobactam so]   Review of Systems Review of  Systems  Genitourinary: Positive for frequency.  All other systems reviewed and are negative.    Physical Exam Updated Vital Signs BP (!) 125/94   Pulse 74   Temp 98.9 F (37.2 C) (Oral)   Resp 14   SpO2 98%   Physical Exam Vitals signs and nursing note reviewed.  Constitutional:      Appearance: Normal appearance.  HENT:     Head: Normocephalic.     Right Ear: Tympanic membrane normal.     Left Ear: Tympanic membrane normal.     Nose: Nose normal.     Mouth/Throat:     Mouth: Mucous membranes are moist.  Eyes:     Pupils: Pupils are equal, round, and reactive to light.  Neck:     Musculoskeletal: Normal range of motion.  Cardiovascular:     Rate and Rhythm: Normal rate.     Pulses: Normal pulses.  Pulmonary:     Effort: Pulmonary effort is normal.  Abdominal:     General: Abdomen is flat.     Palpations: Abdomen is soft.  Musculoskeletal: Normal range of motion.     Comments: R paralumbar vs R CVAT   Skin:    General: Skin is warm.     Capillary  Refill: Capillary refill takes less than 2 seconds.  Neurological:     General: No focal deficit present.     Mental Status: He is alert and oriented to person, place, and time.  Psychiatric:        Mood and Affect: Mood normal.        Behavior: Behavior normal.      ED Treatments / Results  Labs (all labs ordered are listed, but only abnormal results are displayed) Labs Reviewed  URINALYSIS, ROUTINE W REFLEX MICROSCOPIC - Abnormal; Notable for the following components:      Result Value   Glucose, UA >=500 (*)    Ketones, ur 5 (*)    All other components within normal limits  COMPREHENSIVE METABOLIC PANEL - Abnormal; Notable for the following components:   Sodium 134 (*)    Glucose, Bld 339 (*)    All other components within normal limits  CBG MONITORING, ED - Abnormal; Notable for the following components:   Glucose-Capillary 323 (*)    All other components within normal limits  POCT I-STAT EG7 -  Abnormal; Notable for the following components:   pO2, Ven 29.0 (*)    All other components within normal limits  CBG MONITORING, ED - Abnormal; Notable for the following components:   Glucose-Capillary 243 (*)    All other components within normal limits  CBC WITH DIFFERENTIAL/PLATELET    EKG None  Radiology No results found.  Procedures Procedures (including critical care time)  Medications Ordered in ED Medications  sodium chloride 0.9 % bolus 1,000 mL (1,000 mLs Intravenous New Bag/Given 07/26/18 2011)  insulin aspart (novoLOG) injection 5 Units (5 Units Subcutaneous Given 07/26/18 2011)  metFORMIN (GLUCOPHAGE) tablet 500 mg (500 mg Oral Given 07/26/18 2012)     Initial Impression / Assessment and Plan / ED Course  I have reviewed the triage vital signs and the nursing notes.  Pertinent labs & imaging results that were available during my care of the patient were reviewed by me and considered in my medical decision making (see chart for details).       Frank Landry is a 58 y.o. male here with urinary frequency, new onset diabetes. Likely type 2 diabetes. Also has mild R CVAT vs R paralumbar tenderness. Will get labs, VBG, UA. Will hydrate and give metformin, insulin.   10:33 PM CBG down to 243 after IVF, insulin and metformin. Not acidotic. Nl AG. Of note, UA showed no UTI and I think back pain likely MSK in nature and I doubt pyelo or renal colic. He has been drinking sugary drinks and I told him to stop drinking them. Will start metformin 500 mg BID and have him follow up with PCP in a week.    Final Clinical Impressions(s) / ED Diagnoses   Final diagnoses:  None    ED Discharge Orders    None       Charlynne Pander, MD 07/26/18 2234    Charlynne Pander, MD 07/26/18 [phone]

## 2018-07-26 NOTE — ED Triage Notes (Signed)
Pt. Stated, I also have a pain in my rt. Side.

## 2018-08-23 ENCOUNTER — Ambulatory Visit (INDEPENDENT_AMBULATORY_CARE_PROVIDER_SITE_OTHER): Payer: PRIVATE HEALTH INSURANCE | Admitting: Primary Care

## 2019-03-16 ENCOUNTER — Other Ambulatory Visit: Payer: Self-pay

## 2019-03-16 ENCOUNTER — Ambulatory Visit (INDEPENDENT_AMBULATORY_CARE_PROVIDER_SITE_OTHER): Payer: PRIVATE HEALTH INSURANCE | Admitting: Primary Care

## 2019-03-16 ENCOUNTER — Encounter (INDEPENDENT_AMBULATORY_CARE_PROVIDER_SITE_OTHER): Payer: Self-pay | Admitting: Primary Care

## 2019-03-16 VITALS — BP 117/85 | HR 89 | Temp 97.3°F | Ht 73.0 in | Wt 208.8 lb

## 2019-03-16 DIAGNOSIS — E785 Hyperlipidemia, unspecified: Secondary | ICD-10-CM

## 2019-03-16 DIAGNOSIS — Z23 Encounter for immunization: Secondary | ICD-10-CM | POA: Diagnosis not present

## 2019-03-16 DIAGNOSIS — E119 Type 2 diabetes mellitus without complications: Secondary | ICD-10-CM

## 2019-03-16 DIAGNOSIS — R351 Nocturia: Secondary | ICD-10-CM | POA: Diagnosis not present

## 2019-03-16 DIAGNOSIS — K5900 Constipation, unspecified: Secondary | ICD-10-CM | POA: Diagnosis not present

## 2019-03-16 DIAGNOSIS — F5221 Male erectile disorder: Secondary | ICD-10-CM

## 2019-03-16 LAB — POCT GLYCOSYLATED HEMOGLOBIN (HGB A1C): Hemoglobin A1C: 10.9 % — AB (ref 4.0–5.6)

## 2019-03-16 MED ORDER — FREESTYLE SYSTEM KIT: 1.0000 | PACK | 0 refills | Status: AC | PRN

## 2019-03-16 MED ORDER — TADALAFIL 20 MG PO TABS: 10.0000 mg | ORAL_TABLET | ORAL | 3 refills | Status: DC | PRN

## 2019-03-16 MED ORDER — JANUMET 50-1000 MG PO TABS: 1.0000 | ORAL_TABLET | Freq: Two times a day (BID) | ORAL | 0 refills | Status: DC

## 2019-03-16 MED ORDER — SENNA 8.6 MG PO TABS: 1.0000 | ORAL_TABLET | Freq: Every day | ORAL | 0 refills | Status: DC

## 2019-03-16 MED ORDER — GLIPIZIDE 10 MG PO TABS: 10.0000 mg | ORAL_TABLET | Freq: Two times a day (BID) | ORAL | 0 refills | Status: DC

## 2019-03-16 NOTE — Progress Notes (Signed)
Established Patient Office Visit  Subjective:  Patient ID: Frank Landry, male    DOB: Jan 18, 1961  Age: 58 y.o. MRN: [id number]  CC:  Chief Complaint  Patient presents with  . Follow-up    DM    HPI Frank Landry presents for the management of diabetes he has been out of medication since March A1C today 10.9. Medication adjusted.  Past Medical History:  Diagnosis Date  . No pertinent past medical history     Past Surgical History:  Procedure Laterality Date  . HERNIA REPAIR    . NO PAST SURGERIES      Family History  Problem Relation Age of Onset  . Diabetes Mother   . Cancer Father     Social History   Socioeconomic History  . Marital status: Married    Spouse name: Not on file  . Number of children: Not on file  . Years of education: Not on file  . Highest education level: Not on file  Occupational History  . Not on file  Social Needs  . Financial resource strain: Not on file  . Food insecurity    Worry: Not on file    Inability: Not on file  . Transportation needs    Medical: Not on file    Non-medical: Not on file  Tobacco Use  . Smoking status: Former Smoker    Packs/day: 0.25    Years: 38.00    Pack years: 9.50    Types: Cigarettes  . Smokeless tobacco: Never Used  . Tobacco comment: quit 4 years ago   Substance and Sexual Activity  . Alcohol use: No  . Drug use: No    Types: Marijuana    Comment: every 2 weeks- Denies 10/08/15  . Sexual activity: Not on file  Lifestyle  . Physical activity    Days per week: Not on file    Minutes per session: Not on file  . Stress: Not on file  Relationships  . Social Herbalist on phone: Not on file    Gets together: Not on file    Attends religious service: Not on file    Active member of club or organization: Not on file    Attends meetings of clubs or organizations: Not on file    Relationship status: Not on file  . Intimate partner violence    Fear of current or  ex partner: Not on file    Emotionally abused: Not on file    Physically abused: Not on file    Forced sexual activity: Not on file  Other Topics Concern  . Not on file  Social History Narrative  . Not on file    Outpatient Medications Prior to Visit  Medication Sig Dispense Refill  . metFORMIN (GLUCOPHAGE) 500 MG tablet Take 1 tablet (500 mg total) by mouth 2 (two) times daily with a meal. (Patient not taking: Reported on 03/16/2019) 30 tablet 0   No facility-administered medications prior to visit.     Allergies  Allergen Reactions  . Zosyn [Piperacillin Sod-Tazobactam So] Rash    ROS Review of Systems  Gastrointestinal: Positive for constipation.  Skin:       Dry skin  All other systems reviewed and are negative.     Objective:    Physical Exam  Constitutional: He is oriented to person, place, and time. He appears well-developed and well-nourished.  HENT:  Head: Normocephalic.  Eyes: Pupils are equal, round, and reactive to light.  EOM are normal.  Neck: Normal range of motion. Neck supple.  Cardiovascular: Normal rate and regular rhythm.  Pulmonary/Chest: Effort normal and breath sounds normal.  Abdominal: Soft. Bowel sounds are normal.  Musculoskeletal: Normal range of motion.  Neurological: He is alert and oriented to person, place, and time.  Skin: Skin is warm and dry.  Psychiatric: He has a normal mood and affect.    BP 117/85 (BP Location: Left Arm, Patient Position: Sitting, Cuff Size: Large)   Pulse 89   Temp (!) 97.3 F (36.3 C) (Temporal)   Ht 6' 1"  (1.854 m)   Wt 208 lb 12.8 oz (94.7 kg)   SpO2 100%   BMI 27.55 kg/m  Wt Readings from Last 3 Encounters:  03/16/19 208 lb 12.8 oz (94.7 kg)  07/26/18 206 lb (93.4 kg)  04/13/17 238 lb (108 kg)     Health Maintenance Due  Topic Date Due  . URINE MICROALBUMIN  10/15/1968    There are no preventive care reminders to display for this patient.  Lab Results  Component Value Date   TSH 2.310  04/13/2017   Lab Results  Component Value Date   WBC 4.7 07/26/2018   HGB 14.3 07/26/2018   HCT 42.0 07/26/2018   MCV 92.3 07/26/2018   PLT 189 07/26/2018   Lab Results  Component Value Date   NA 135 07/26/2018   K 4.3 07/26/2018   CO2 25 07/26/2018   GLUCOSE 339 (H) 07/26/2018   BUN 13 07/26/2018   CREATININE 0.93 07/26/2018   BILITOT 0.8 07/26/2018   ALKPHOS 64 07/26/2018   AST 18 07/26/2018   ALT 22 07/26/2018   PROT 6.9 07/26/2018   ALBUMIN 3.8 07/26/2018   CALCIUM 9.5 07/26/2018   ANIONGAP 8 07/26/2018   Lab Results  Component Value Date   CHOL 219 (H) 01/15/2017   Lab Results  Component Value Date   HDL 52 01/15/2017   Lab Results  Component Value Date   LDLCALC 150 (H) 01/15/2017   Lab Results  Component Value Date   TRIG 83 01/15/2017   Lab Results  Component Value Date   CHOLHDL 4.2 01/15/2017   Lab Results  Component Value Date   HGBA1C 10.9 (A) 03/16/2019      Assessment & Plan:  Frank Landry was seen today for follow-up.  Diagnoses and all orders for this visit:  Type 2 diabetes mellitus without complication, without long-term current use of insulin ([place]) New diagnosis . Diabetes education - discussed  Less than 6.5 hemoglobin A1c.  Decrease foods that are high in carbohydrates are the following rice, potatoes, breads, sugars, and pastas.  Reduction in the intake (eating) will assist in lowering your blood sugars. Exercise at least 30 minutes daily.  -     HgB A1c 10.9 -     Microalbumin, urine -     CBC with Differential -     CMP14+EGFR -     sitaGLIPtin-metformin (JANUMET) 50-1000 MG tablet; Take 1 tablet by mouth 2 (two) times daily with a meal. -     glipiZIDE (GLUCOTROL) 10 MG tablet; Take 1 tablet (10 mg total) by mouth 2 (two) times daily before a meal.  Nocturia BPH can cause increase in urination or flow stream changes. Rule out with  -     PSA  Hyperlipidemia, unspecified hyperlipidemia type   decrease your fatty foods, red  meat, cheese, milk and increase fiber like whole grains and veggies. You can also add a fiber supplement  like Metamucil or Benefiber.  -     Lipid Panel  Constipation, unspecified constipation type Increase fiber in your diet MANAGEMENT OF CHRONIC CONSTIPATION  Drink fluids in the recommended amount everyday. Recommend amount is 8 cups of water daily. Do not replace water with Gatorade or Powerade as these should only be used when you are dehydrated.   Eat lots of high fiber foods-fruits, veggies, bran and whole grain instead of white bread  Be active everyday. Inactivity makes constipation worse.  Add psyllium daily (Metamucil) which comes in capsules now. Start very low dose and work up to recommended dose on bottle daily.  Stay away from [place] or any magnesium containing laxative, unless you need it to clear things out rarely. It is an addictive laxative and your gut will become dependent on it.   senna (SENOKOT) 8.6 MG TABS tablet; Take 1 tablet (8.6 mg total) by mouth daily.  Erectile disorder, acquired, generalized, moderate Erectile dysfunction (ED) he is having a "flat tire"  Explain this can be related to elevated blood sugars.  tadalafil (CIALIS) 20 MG tablet; Take 0.5-1 tablets (10-20 mg total) by mouth every other day as needed for erectile dysfunction.   Need for prophylactic vaccination and inoculation against influenza Completed   Other orders -     senna (SENOKOT) 8.6 MG TABS tablet; Take 1 tablet (8.6 mg total) by mouth daily. -     tadalafil (CIALIS) 20 MG tablet; Take 0.5-1 tablets (10-20 mg total) by mouth every other day as needed for erectile dysfunction.   Meds ordered this encounter  Medications  . sitaGLIPtin-metformin (JANUMET) 50-1000 MG tablet    Sig: Take 1 tablet by mouth 2 (two) times daily with a meal.    Dispense:  180 tablet    Refill:  0  . glipiZIDE (GLUCOTROL) 10 MG tablet    Sig: Take 1 tablet (10 mg total) by mouth 2 (two) times  daily before a meal.    Dispense:  180 tablet    Refill:  0  . senna (SENOKOT) 8.6 MG TABS tablet    Sig: Take 1 tablet (8.6 mg total) by mouth daily.    Dispense:  120 tablet    Refill:  0  . tadalafil (CIALIS) 20 MG tablet    Sig: Take 0.5-1 tablets (10-20 mg total) by mouth every other day as needed for erectile dysfunction.    Dispense:  5 tablet    Refill:  3  . glucose monitoring kit (FREESTYLE) monitoring kit    Sig: 1 each by Does not apply route as needed for other. Please include lancet and test strip . Testing once daily    Dispense:  1 each    Refill:  0    Follow-up: Return in about 3 months (around 06/16/2019) for uncontrol diabetes.    Kerin Perna, NP

## 2019-03-16 NOTE — Patient Instructions (Signed)
-   Exercise at least 5 times a week for 30 minutes or preferably daily.  - No Smoking - Drink less than 2 drinks a day.  - Monitor your feet for sores - Have yearly Eye Exams - Recommend annual Flu vaccine  - Recommend Pneumovax and Prevnar vaccines - Shingles Vaccine (Zostavax) if over 60 y.o.  Goals:   - BMI less than 24 - Fasting sugar less than 130 or less than 150 if tapering medicines to lose weight  - Systolic BP less than 790  - Diastolic BP less than 80 - Bad LDL Cholesterol less than 70 - Triglycerides less than 150 Foods that are high in carbohydrates are the following rice, potatoes, breads, sugars, and pastas.  Reduction in the intake (eating) will assist in lowering your blood sugars.

## 2019-03-17 LAB — CBC WITH DIFFERENTIAL/PLATELET
Basophils Absolute: 0.1 10*3/uL (ref 0.0–0.2)
Basos: 1 %
EOS (ABSOLUTE): 0.1 10*3/uL (ref 0.0–0.4)
Eos: 2 %
Hematocrit: 40.5 % (ref 37.5–51.0)
Hemoglobin: 14.1 g/dL (ref 13.0–17.7)
Immature Grans (Abs): 0 10*3/uL (ref 0.0–0.1)
Immature Granulocytes: 0 %
Lymphocytes Absolute: 2.7 10*3/uL (ref 0.7–3.1)
Lymphs: 45 %
MCH: 32 pg (ref 26.6–33.0)
MCHC: 34.8 g/dL (ref 31.5–35.7)
MCV: 92 fL (ref 79–97)
Monocytes Absolute: 0.4 10*3/uL (ref 0.1–0.9)
Monocytes: 7 %
Neutrophils Absolute: 2.7 10*3/uL (ref 1.4–7.0)
Neutrophils: 45 %
Platelets: 252 10*3/uL (ref 150–450)
RBC: 4.4 x10E6/uL (ref 4.14–5.80)
RDW: 12.7 % (ref 11.6–15.4)
WBC: 6 10*3/uL (ref 3.4–10.8)

## 2019-03-17 LAB — CMP14+EGFR
ALT: 15 IU/L (ref 0–44)
AST: 18 IU/L (ref 0–40)
Albumin/Globulin Ratio: 1.7 (ref 1.2–2.2)
Albumin: 4.5 g/dL (ref 3.8–4.9)
Alkaline Phosphatase: 86 IU/L (ref 39–117)
BUN/Creatinine Ratio: 14 (ref 10–24)
BUN: 12 mg/dL (ref 8–27)
Bilirubin Total: 0.4 mg/dL (ref 0.0–1.2)
CO2: 22 mmol/L (ref 20–29)
Calcium: 9.8 mg/dL (ref 8.6–10.2)
Chloride: 100 mmol/L (ref 96–106)
Creatinine, Ser: 0.87 mg/dL (ref 0.76–1.27)
GFR calc Af Amer: 108 mL/min/{1.73_m2} (ref >59)
GFR calc non Af Amer: 94 mL/min/{1.73_m2} (ref >59)
Globulin, Total: 2.7 g/dL (ref 1.5–4.5)
Glucose: 206 mg/dL — ABNORMAL HIGH (ref 65–99)
Potassium: 4 mmol/L (ref 3.5–5.2)
Sodium: 137 mmol/L (ref 134–144)
Total Protein: 7.2 g/dL (ref 6.0–8.5)

## 2019-03-17 LAB — MICROALBUMIN, URINE: Microalbumin, Urine: 16.6 ug/mL

## 2019-03-17 LAB — LIPID PANEL
Chol/HDL Ratio: 3.7 ratio (ref 0.0–5.0)
Cholesterol, Total: 213 mg/dL — ABNORMAL HIGH (ref 100–199)
HDL: 57 mg/dL (ref >39)
LDL Chol Calc (NIH): 140 mg/dL — ABNORMAL HIGH (ref 0–99)
Triglycerides: 89 mg/dL (ref 0–149)
VLDL Cholesterol Cal: 16 mg/dL (ref 5–40)

## 2019-03-17 LAB — PSA: Prostate Specific Ag, Serum: 1.6 ng/mL (ref 0.0–4.0)

## 2019-03-21 ENCOUNTER — Other Ambulatory Visit (INDEPENDENT_AMBULATORY_CARE_PROVIDER_SITE_OTHER): Payer: Self-pay | Admitting: Primary Care

## 2019-03-21 MED ORDER — ATORVASTATIN CALCIUM 20 MG PO TABS: 20.0000 mg | ORAL_TABLET | Freq: Every day | ORAL | 3 refills | Status: DC

## 2019-03-22 ENCOUNTER — Telehealth (INDEPENDENT_AMBULATORY_CARE_PROVIDER_SITE_OTHER): Payer: Self-pay

## 2019-03-22 DIAGNOSIS — E119 Type 2 diabetes mellitus without complications: Secondary | ICD-10-CM

## 2019-03-22 MED ORDER — JANUMET 50-1000 MG PO TABS: 1.0000 | ORAL_TABLET | Freq: Two times a day (BID) | ORAL | 0 refills | Status: DC

## 2019-03-22 MED FILL — !JANUMET 50-1000MG TABLET: 50-1000 | 30 days supply | Qty: 60 | Fill #0

## 2019-03-22 NOTE — Telephone Encounter (Signed)
-----   Message from Kerin Perna, NP sent at 03/21/2019  4:36 PM EDT ----- I have reviewed all labs and they are normal except elevated cholesterol and LDL.  LDL is the bad cholesterol that can lead to heart attack and stroke. Decrease your fatty foods, red meat, cheese, milk and increase fiber like whole grains and veggies. You can also add a fiber supplement like Metamucil or Benefiber. Sent in a prescription for atorvastatin

## 2019-03-22 NOTE — Telephone Encounter (Signed)
Patient states his Celesta Gentile is too expensive at [facility]. Would like it sent to [facility]. CMA sent rx. Nat Christen, CMA

## 2019-03-22 NOTE — Telephone Encounter (Signed)
Patient was unavailable. Results provided to his spouse. She will inform patient when he returns from work. Nat Christen, CMA

## 2019-03-23 ENCOUNTER — Other Ambulatory Visit (INDEPENDENT_AMBULATORY_CARE_PROVIDER_SITE_OTHER): Payer: Self-pay | Admitting: Primary Care

## 2019-03-23 DIAGNOSIS — E119 Type 2 diabetes mellitus without complications: Secondary | ICD-10-CM

## 2019-03-23 MED ORDER — SENNA 8.6 MG PO TABS: 1.0000 | ORAL_TABLET | Freq: Every day | ORAL | 0 refills | Status: DC

## 2019-03-23 MED ORDER — GLIPIZIDE 10 MG PO TABS: 10.0000 mg | ORAL_TABLET | Freq: Two times a day (BID) | ORAL | 0 refills | Status: DC

## 2019-03-23 MED ORDER — JANUMET 50-1000 MG PO TABS: 1.0000 | ORAL_TABLET | Freq: Two times a day (BID) | ORAL | 0 refills | Status: DC

## 2019-03-23 MED FILL — glipiZIDE 10 MG TABS: 10 | 30 days supply | Qty: 60 | Fill #0

## 2019-04-21 MED FILL — JANUMET 50-1,000 MG TABLET: 50-1000 | 30 days supply | Qty: 60 | Fill #1

## 2019-05-24 MED FILL — JANUMET 50-1,000 MG TABLET: 50-1000 | 30 days supply | Qty: 60 | Fill #2

## 2019-06-20 ENCOUNTER — Encounter (INDEPENDENT_AMBULATORY_CARE_PROVIDER_SITE_OTHER): Payer: Self-pay | Admitting: Primary Care

## 2019-06-20 ENCOUNTER — Ambulatory Visit (INDEPENDENT_AMBULATORY_CARE_PROVIDER_SITE_OTHER): Payer: Self-pay | Admitting: Primary Care

## 2019-06-20 ENCOUNTER — Other Ambulatory Visit: Payer: Self-pay

## 2019-06-20 VITALS — BP 125/90 | HR 78 | Temp 97.2°F | Ht 73.0 in | Wt 203.8 lb

## 2019-06-20 DIAGNOSIS — E119 Type 2 diabetes mellitus without complications: Secondary | ICD-10-CM

## 2019-06-20 DIAGNOSIS — F5221 Male erectile disorder: Secondary | ICD-10-CM

## 2019-06-20 DIAGNOSIS — E785 Hyperlipidemia, unspecified: Secondary | ICD-10-CM

## 2019-06-20 LAB — POCT GLYCOSYLATED HEMOGLOBIN (HGB A1C): Hemoglobin A1C: 6 % — AB (ref 4.0–5.6)

## 2019-06-20 LAB — GLUCOSE, POCT (MANUAL RESULT ENTRY): POC Glucose: 88 mg/dl (ref 70–99)

## 2019-06-20 MED ORDER — TADALAFIL 20 MG PO TABS: 10.0000 mg | ORAL_TABLET | ORAL | 3 refills | Status: DC | PRN

## 2019-06-20 MED ORDER — GLIPIZIDE 10 MG PO TABS: 10.0000 mg | ORAL_TABLET | Freq: Two times a day (BID) | ORAL | 1 refills | Status: DC

## 2019-06-20 MED ORDER — JANUMET 50-1000 MG PO TABS: 1.0000 | ORAL_TABLET | Freq: Two times a day (BID) | ORAL | 1 refills | Status: DC

## 2019-06-20 MED ORDER — ATORVASTATIN CALCIUM 20 MG PO TABS: 20.0000 mg | ORAL_TABLET | Freq: Every day | ORAL | 3 refills | Status: DC

## 2019-06-20 NOTE — Patient Instructions (Signed)

## 2019-06-20 NOTE — Progress Notes (Signed)
Established Patient Office Visit  Subjective:  Patient ID: Frank Landry, male    DOB: November 27, 1960  Age: 59 y.o. MRN: [id number]  CC:  Chief Complaint  Patient presents with  . Follow-up    uncontrolled DM     HPI Frank Landry presents for evaluation of uncontrolled diabetes today his A1C is 6.0 3 months ago 10.3. Requesting refills on the "sex pill". Voiced no other complaints or concerns  Past Medical History:  Diagnosis Date  . No pertinent past medical history     Past Surgical History:  Procedure Laterality Date  . HERNIA REPAIR    . NO PAST SURGERIES      Family History  Problem Relation Age of Onset  . Diabetes Mother   . Cancer Father     Social History   Socioeconomic History  . Marital status: Married    Spouse name: Not on file  . Number of children: Not on file  . Years of education: Not on file  . Highest education level: Not on file  Occupational History  . Not on file  Tobacco Use  . Smoking status: Former Smoker    Packs/day: 0.25    Years: 38.00    Pack years: 9.50    Types: Cigarettes  . Smokeless tobacco: Never Used  . Tobacco comment: quit 4 years ago   Substance and Sexual Activity  . Alcohol use: No  . Drug use: No    Types: Marijuana    Comment: every 2 weeks- Denies 10/08/15  . Sexual activity: Not on file  Other Topics Concern  . Not on file  Social History Narrative  . Not on file   Social Determinants of Health   Financial Resource Strain:   . Difficulty of Paying Living Expenses: Not on file  Food Insecurity:   . Worried About Charity fundraiser in the Last Year: Not on file  . Ran Out of Food in the Last Year: Not on file  Transportation Needs:   . Lack of Transportation (Medical): Not on file  . Lack of Transportation (Non-Medical): Not on file  Physical Activity:   . Days of Exercise per Week: Not on file  . Minutes of Exercise per Session: Not on file  Stress:   . Feeling of Stress : Not  on file  Social Connections:   . Frequency of Communication with Friends and Family: Not on file  . Frequency of Social Gatherings with Friends and Family: Not on file  . Attends Religious Services: Not on file  . Active Member of Clubs or Organizations: Not on file  . Attends Archivist Meetings: Not on file  . Marital Status: Not on file  Intimate Partner Violence:   . Fear of Current or Ex-Partner: Not on file  . Emotionally Abused: Not on file  . Physically Abused: Not on file  . Sexually Abused: Not on file    Outpatient Medications Prior to Visit  Medication Sig Dispense Refill  . atorvastatin (LIPITOR) 20 MG tablet Take 1 tablet (20 mg total) by mouth daily. 90 tablet 3  . glipiZIDE (GLUCOTROL) 10 MG tablet Take 1 tablet (10 mg total) by mouth 2 (two) times daily before a meal. 180 tablet 0  . glucose monitoring kit (FREESTYLE) monitoring kit 1 each by Does not apply route as needed for other. Please include lancet and test strip . Testing once daily 1 each 0  . senna (SENOKOT) 8.6 MG TABS tablet  Take 1 tablet (8.6 mg total) by mouth daily. 120 tablet 0  . sitaGLIPtin-metformin (JANUMET) 50-1000 MG tablet Take 1 tablet by mouth 2 (two) times daily with a meal. 180 tablet 0  . tadalafil (CIALIS) 20 MG tablet Take 0.5-1 tablets (10-20 mg total) by mouth every other day as needed for erectile dysfunction. 5 tablet 3   No facility-administered medications prior to visit.    Allergies  Allergen Reactions  . Zosyn [Piperacillin Sod-Tazobactam So] Rash    ROS Review of Systems  All other systems reviewed and are negative.     Objective:    Physical Exam  Constitutional: He is oriented to person, place, and time. He appears well-developed and well-nourished.  HENT:  Head: Normocephalic.  Cardiovascular: Normal rate and regular rhythm.  Pulmonary/Chest: Effort normal and breath sounds normal.  Abdominal: Soft. Bowel sounds are normal.  Musculoskeletal:         General: Normal range of motion.     Cervical back: Neck supple.  Neurological: He is oriented to person, place, and time.  Skin: Skin is warm.  Psychiatric: He has a normal mood and affect. His behavior is normal. Judgment and thought content normal.    BP 125/90 (BP Location: Right Arm, Patient Position: Sitting, Cuff Size: Normal)   Pulse 78   Temp (!) 97.2 F (36.2 C) (Temporal)   Ht 6' 1"  (1.854 m)   Wt 203 lb 12.8 oz (92.4 kg)   SpO2 98%   BMI 26.89 kg/m  Wt Readings from Last 3 Encounters:  06/20/19 203 lb 12.8 oz (92.4 kg)  03/16/19 208 lb 12.8 oz (94.7 kg)  07/26/18 206 lb (93.4 kg)     There are no preventive care reminders to display for this patient.  There are no preventive care reminders to display for this patient.  Lab Results  Component Value Date   TSH 2.310 04/13/2017   Lab Results  Component Value Date   WBC 6.0 03/16/2019   HGB 14.1 03/16/2019   HCT 40.5 03/16/2019   MCV 92 03/16/2019   PLT 252 03/16/2019   Lab Results  Component Value Date   NA 137 03/16/2019   K 4.0 03/16/2019   CO2 22 03/16/2019   GLUCOSE 206 (H) 03/16/2019   BUN 12 03/16/2019   CREATININE 0.87 03/16/2019   BILITOT 0.4 03/16/2019   ALKPHOS 86 03/16/2019   AST 18 03/16/2019   ALT 15 03/16/2019   PROT 7.2 03/16/2019   ALBUMIN 4.5 03/16/2019   CALCIUM 9.8 03/16/2019   ANIONGAP 8 07/26/2018   Lab Results  Component Value Date   CHOL 213 (H) 03/16/2019   Lab Results  Component Value Date   HDL 57 03/16/2019   Lab Results  Component Value Date   LDLCALC 140 (H) 03/16/2019   Lab Results  Component Value Date   TRIG 89 03/16/2019   Lab Results  Component Value Date   CHOLHDL 3.7 03/16/2019   Lab Results  Component Value Date   HGBA1C 6.0 (A) 06/20/2019      Assessment & Plan:  [name] was seen today for follow-up.  Diagnoses and all orders for this visit:  Type 2 diabetes mellitus without complication, without long-term current use of insulin  (HCC) Blood sugars/ A1C went from uncontrol to well control with diet , exercise and taking medication as prescribed. Very please with A1C 6.0 -     HgB A1c -     Glucose (CBG) -     sitaGLIPtin-metformin (  JANUMET) 50-1000 MG tablet; Take 1 tablet by mouth 2 (two) times daily with a meal. -     glipiZIDE (GLUCOTROL) 10 MG tablet; Take 1 tablet (10 mg total) by mouth 2 (two) times daily before a meal.  Erectile disorder, acquired, generalized, moderate He is having the inability to get or keep an erection firm enough to have sexual intercourse.  Refilled Cialis take as directed    Hyperlipidemia, unspecified hyperlipidemia type Follow up fasting lipids continue atrovastatin 10m at bedtime.   Expecting cholesterol to be improved with drastic diet modification. Continue to monitor your intake of fatty foods, red meat, cheese, milk and increase fiber like whole grains and veggies. You can also add a fiber supplement like Metamucil or Benefiber.   Other orders -     tadalafil (CIALIS) 20 MG tablet; Take 0.5-1 tablets (10-20 mg total) by mouth every other day as needed for erectile dysfunction. -     atorvastatin (LIPITOR) 20 MG tablet; Take 1 tablet (20 mg total) by mouth daily.    No orders of the defined types were placed in this encounter.   Follow-up: No follow-ups on file.    MKerin Perna NP

## 2019-06-28 MED FILL — glipiZIDE 10 MG TABS: 10 | 30 days supply | Qty: 60 | Fill #1

## 2019-06-28 MED FILL — JANUMET 50-1,000 MG TABLET: 50-1000 | 30 days supply | Qty: 60 | Fill #0

## 2019-06-30 MED FILL — ATORVASTATIN CALCIUM 20 MG: 20 | 30 days supply | Qty: 30 | Fill #0

## 2019-06-30 MED FILL — TADALAFIL 20 MG TABS: 20 | 30 days supply | Qty: 10 | Fill #0

## 2019-07-14 MED FILL — ATORVASTATIN CALCIUM 20 MG: 20 | 30 days supply | Qty: 30 | Fill #0

## 2019-07-14 MED FILL — TADALAFIL 20 MG TABS: 20 | 30 days supply | Qty: 10 | Fill #0

## 2019-07-27 MED FILL — glipiZIDE 10 MG TABS: 10 | 30 days supply | Qty: 60 | Fill #2

## 2019-07-27 MED FILL — JANUMET 50-1,000 MG TABLET: 50-1000 | 30 days supply | Qty: 60 | Fill #1

## 2019-08-18 MED FILL — ATORVASTATIN CALCIUM 20 MG: 20 | 30 days supply | Qty: 30 | Fill #1

## 2019-08-30 MED FILL — TADALAFIL (PAH) 20 MG TABS: 20 | 30 days supply | Qty: 10 | Fill #0

## 2019-08-30 MED FILL — JANUMET 50-1,000 MG TABLET: 50-1000 | 30 days supply | Qty: 60 | Fill #2

## 2019-08-31 MED FILL — glipiZIDE 10 MG TABS: 10 | 90 days supply | Qty: 180 | Fill #0

## 2019-09-12 MED FILL — ATORVASTATIN CALCIUM 20 MG: 20 | 30 days supply | Qty: 30 | Fill #2

## 2019-10-03 MED FILL — JANUMET 50-1,000 MG TABLET: 50-1000 | 30 days supply | Qty: 60 | Fill #0

## 2019-11-03 MED FILL — JANUMET 50-1,000 MG TABLET: 50-1000 | 30 days supply | Qty: 60 | Fill #1

## 2019-11-30 MED FILL — glipiZIDE 10 MG TABS: 10 | 90 days supply | Qty: 180 | Fill #1

## 2019-12-08 MED FILL — ATORVASTATIN CALCIUM 20 MG: 20 | 30 days supply | Qty: 30 | Fill #5

## 2019-12-09 MED FILL — $JANUMET 50-1000 MG TABLET: 50-1000 | 90 days supply | Qty: 180 | Fill #2

## 2019-12-09 MED FILL — TADALAFIL 20 MG TABS: 20 | 30 days supply | Qty: 10 | Fill #0

## 2019-12-19 ENCOUNTER — Encounter (INDEPENDENT_AMBULATORY_CARE_PROVIDER_SITE_OTHER): Payer: Self-pay | Admitting: Primary Care

## 2019-12-19 ENCOUNTER — Ambulatory Visit (INDEPENDENT_AMBULATORY_CARE_PROVIDER_SITE_OTHER): Payer: PRIVATE HEALTH INSURANCE | Admitting: Primary Care

## 2019-12-19 ENCOUNTER — Other Ambulatory Visit: Payer: Self-pay | Admitting: Primary Care

## 2019-12-19 ENCOUNTER — Other Ambulatory Visit: Payer: Self-pay

## 2019-12-19 VITALS — BP 106/67 | HR 75 | Temp 98.3°F | Ht 73.0 in | Wt 195.4 lb

## 2019-12-19 DIAGNOSIS — F5221 Male erectile disorder: Secondary | ICD-10-CM

## 2019-12-19 DIAGNOSIS — G8929 Other chronic pain: Secondary | ICD-10-CM

## 2019-12-19 DIAGNOSIS — L309 Dermatitis, unspecified: Secondary | ICD-10-CM

## 2019-12-19 DIAGNOSIS — E785 Hyperlipidemia, unspecified: Secondary | ICD-10-CM

## 2019-12-19 DIAGNOSIS — E119 Type 2 diabetes mellitus without complications: Secondary | ICD-10-CM

## 2019-12-19 DIAGNOSIS — M25562 Pain in left knee: Secondary | ICD-10-CM | POA: Diagnosis not present

## 2019-12-19 LAB — POCT GLYCOSYLATED HEMOGLOBIN (HGB A1C): Hemoglobin A1C: 5.9 % — AB (ref 4.0–5.6)

## 2019-12-19 LAB — GLUCOSE, POCT (MANUAL RESULT ENTRY): POC Glucose: 60 mg/dl — AB (ref 70–99)

## 2019-12-19 MED ORDER — LISINOPRIL 2.5 MG PO TABS: 2.5000 mg | ORAL_TABLET | Freq: Every day | ORAL | 1 refills | Status: DC

## 2019-12-19 MED ORDER — SILDENAFIL CITRATE 100 MG PO TABS: 50.0000 mg | ORAL_TABLET | Freq: Every day | ORAL | 11 refills | Status: DC | PRN

## 2019-12-19 MED ORDER — EUCERIN ECZEMA RELIEF 1 % EX CREA: 156.0000 g | TOPICAL_CREAM | Freq: Two times a day (BID) | CUTANEOUS | 2 refills | Status: AC | PRN

## 2019-12-19 MED FILL — SILDENAFIL CITRATE 100 MG T: 100 | 30 days supply | Qty: 10 | Fill #0

## 2019-12-19 MED FILL — LISINOPRIL 2.5 MG TABLET: 2.5 | 30 days supply | Qty: 30 | Fill #0

## 2019-12-19 NOTE — Patient Instructions (Signed)

## 2019-12-19 NOTE — Progress Notes (Signed)
Established Patient Office Visit  Subjective:  Patient ID: Frank Landry, male    DOB: 12-24-1960  Age: 59 y.o. MRN: [id number]  CC: No chief complaint on file.   HPI Mr. Frank Landry is 59 year old male presents for management of diabetes and hyperlipidemia. He fell a month ago and his left knee has been hurting, swelling and pooping. Also, states Cialis is no good.  Past Medical History:  Diagnosis Date  . No pertinent past medical history     Past Surgical History:  Procedure Laterality Date  . HERNIA REPAIR    . NO PAST SURGERIES      Family History  Problem Relation Age of Onset  . Diabetes Mother   . Cancer Father     Social History   Socioeconomic History  . Marital status: Married    Spouse name: Not on file  . Number of children: Not on file  . Years of education: Not on file  . Highest education level: Not on file  Occupational History  . Not on file  Tobacco Use  . Smoking status: Former Smoker    Packs/day: 0.25    Years: 38.00    Pack years: 9.50    Types: Cigarettes  . Smokeless tobacco: Never Used  . Tobacco comment: quit 4 years ago   Substance and Sexual Activity  . Alcohol use: No  . Drug use: No    Types: Marijuana    Comment: every 2 weeks- Denies 10/08/15  . Sexual activity: Not on file  Other Topics Concern  . Not on file  Social History Narrative  . Not on file   Social Determinants of Health   Financial Resource Strain:   . Difficulty of Paying Living Expenses:   Food Insecurity:   . Worried About Charity fundraiser in the Last Year:   . Arboriculturist in the Last Year:   Transportation Needs:   . Film/video editor (Medical):   Marland Kitchen Lack of Transportation (Non-Medical):   Physical Activity:   . Days of Exercise per Week:   . Minutes of Exercise per Session:   Stress:   . Feeling of Stress :   Social Connections:   . Frequency of Communication with Friends and Family:   . Frequency of Social  Gatherings with Friends and Family:   . Attends Religious Services:   . Active Member of Clubs or Organizations:   . Attends Archivist Meetings:   Marland Kitchen Marital Status:   Intimate Partner Violence:   . Fear of Current or Ex-Partner:   . Emotionally Abused:   Marland Kitchen Physically Abused:   . Sexually Abused:     Outpatient Medications Prior to Visit  Medication Sig Dispense Refill  . atorvastatin (LIPITOR) 20 MG tablet Take 1 tablet (20 mg total) by mouth daily. 90 tablet 3  . glucose monitoring kit (FREESTYLE) monitoring kit 1 each by Does not apply route as needed for other. Please include lancet and test strip . Testing once daily 1 each 0  . senna (SENOKOT) 8.6 MG TABS tablet Take 1 tablet (8.6 mg total) by mouth daily. 120 tablet 0  . sitaGLIPtin-metformin (JANUMET) 50-1000 MG tablet Take 1 tablet by mouth 2 (two) times daily with a meal. 180 tablet 1  . glipiZIDE (GLUCOTROL) 10 MG tablet Take 1 tablet (10 mg total) by mouth 2 (two) times daily before a meal. 180 tablet 1  . tadalafil (CIALIS) 20 MG tablet  Take 0.5-1 tablets (10-20 mg total) by mouth every other day as needed for erectile dysfunction. 5 tablet 3   No facility-administered medications prior to visit.    Allergies  Allergen Reactions  . Zosyn [Piperacillin Sod-Tazobactam So] Rash    ROS Review of Systems  Genitourinary:       Erectile dysfunction    Musculoskeletal:       Left knee   All other systems reviewed and are negative.     Objective:    Physical Exam Vitals reviewed.  Constitutional:      Appearance: Normal appearance.  HENT:     Head: Normocephalic.     Right Ear: Tympanic membrane normal.     Left Ear: Tympanic membrane normal.     Mouth/Throat:     Mouth: Mucous membranes are moist.  Eyes:     Extraocular Movements: Extraocular movements intact.     Pupils: Pupils are equal, round, and reactive to light.  Cardiovascular:     Rate and Rhythm: Normal rate and regular rhythm.   Pulmonary:     Effort: Pulmonary effort is normal.  Abdominal:     General: Bowel sounds are normal.  Musculoskeletal:        General: Normal range of motion.     Cervical back: Normal range of motion and neck supple.  Skin:    General: Skin is warm and dry.  Neurological:     Mental Status: He is alert and oriented to person, place, and time.  Psychiatric:        Mood and Affect: Mood normal.        Thought Content: Thought content normal.        Judgment: Judgment normal.     BP 106/67 (BP Location: Left Arm, Patient Position: Sitting, Cuff Size: Normal)   Pulse 75   Temp 98.3 F (36.8 C) (Oral)   Ht 6' 1"  (1.854 m)   Wt 195 lb 6.4 oz (88.6 kg)   SpO2 99%   BMI 25.78 kg/m  Wt Readings from Last 3 Encounters:  12/19/19 195 lb 6.4 oz (88.6 kg)  06/20/19 203 lb 12.8 oz (92.4 kg)  03/16/19 208 lb 12.8 oz (94.7 kg)     There are no preventive care reminders to display for this patient.  There are no preventive care reminders to display for this patient.  Lab Results  Component Value Date   TSH 2.310 04/13/2017   Lab Results  Component Value Date   WBC 6.0 03/16/2019   HGB 14.1 03/16/2019   HCT 40.5 03/16/2019   MCV 92 03/16/2019   PLT 252 03/16/2019   Lab Results  Component Value Date   NA 137 03/16/2019   K 4.0 03/16/2019   CO2 22 03/16/2019   GLUCOSE 206 (H) 03/16/2019   BUN 12 03/16/2019   CREATININE 0.87 03/16/2019   BILITOT 0.4 03/16/2019   ALKPHOS 86 03/16/2019   AST 18 03/16/2019   ALT 15 03/16/2019   PROT 7.2 03/16/2019   ALBUMIN 4.5 03/16/2019   CALCIUM 9.8 03/16/2019   ANIONGAP 8 07/26/2018   Lab Results  Component Value Date   CHOL 213 (H) 03/16/2019   Lab Results  Component Value Date   HDL 57 03/16/2019   Lab Results  Component Value Date   LDLCALC 140 (H) 03/16/2019   Lab Results  Component Value Date   TRIG 89 03/16/2019   Lab Results  Component Value Date   CHOLHDL 3.7 03/16/2019   Lab Results  Component Value Date    HGBA1C 5.9 (A) 12/19/2019      Assessment & Plan:  Diagnoses and all orders for this visit:  Type 2 diabetes mellitus without complication, without long-term current use of insulin (HCC) Discontinue glipizide 69m bid. < 6.5 hemoglobin A1c at goal  Continue foods that are high in carbohydrates are the following rice, potatoes, breads, sugars, and pastas.  Reduction in the intake (eating) will assist in lowering your blood sugars. -     Glucose (CBG) -     HgB A1c 5.9 down from 6.0 -     CBC with Differential -     CMP14+EGFR; Future  Erectile disorder, acquired, generalized, moderate This is a new problem. Additional work-up: No. We discussed different medications for ED including Viagra, Levitra, and Cialis. We also discussed the risks of such medications, including priapism, monocular vision loss, and hypotension with nitrates. All questions were answered. The patient opted for:  [x]   Trial of Viagra; no contraindications. []   Trial of Cialis; no contraindications. []   Trial of Levitra; no contraindications. []   Referral to Urology for further evaluation.   Hyperlipidemia, unspecified hyperlipidemia type Continue decrease your fatty foods, red meat, cheese, milk and increase fiber like whole grains and veggies.     -     Lipid Panel  Chronic pain of left knee -     Ambulatory referral to Orthopedic Surgery  Other orders -     sildenafil (VIAGRA) 100 MG tablet; Take 0.5-1 tablets (50-100 mg total) by mouth daily as needed for erectile dysfunction. -     lisinopril (ZESTRIL) 2.5 MG tablet; Take 1 tablet (2.5 mg total) by mouth daily.    Meds ordered this encounter  Medications  . sildenafil (VIAGRA) 100 MG tablet    Sig: Take 0.5-1 tablets (50-100 mg total) by mouth daily as needed for erectile dysfunction.    Dispense:  5 tablet    Refill:  11  . lisinopril (ZESTRIL) 2.5 MG tablet    Sig: Take 1 tablet (2.5 mg total) by mouth daily.    Dispense:  90 tablet    Refill:   1  . Colloidal Oatmeal (EUCERIN ECZEMA RELIEF) 1 % CREA    Sig: Apply 156 g topically 2 (two) times daily as needed.    Dispense:  156 g    Refill:  2    Mix with 1% cortisone    Follow-up: Return in about 6 months (around 06/20/2020) for follow up T2D.    MKerin Perna NP

## 2019-12-19 NOTE — Addendum Note (Signed)
Addended by: Bronwen Betters on: 12/19/2019 10:06 AM   Modules accepted: Orders

## 2019-12-20 LAB — CBC WITH DIFFERENTIAL/PLATELET
Basophils Absolute: 0 10*3/uL (ref 0.0–0.2)
Basos: 1 %
EOS (ABSOLUTE): 0.2 10*3/uL (ref 0.0–0.4)
Eos: 2 %
Hematocrit: 42.3 % (ref 37.5–51.0)
Hemoglobin: 14.3 g/dL (ref 13.0–17.7)
Immature Grans (Abs): 0 10*3/uL (ref 0.0–0.1)
Immature Granulocytes: 0 %
Lymphocytes Absolute: 1.8 10*3/uL (ref 0.7–3.1)
Lymphs: 29 %
MCH: 32.4 pg (ref 26.6–33.0)
MCHC: 33.8 g/dL (ref 31.5–35.7)
MCV: 96 fL (ref 79–97)
Monocytes Absolute: 0.5 10*3/uL (ref 0.1–0.9)
Monocytes: 8 %
Neutrophils Absolute: 3.7 10*3/uL (ref 1.4–7.0)
Neutrophils: 60 %
Platelets: 223 10*3/uL (ref 150–450)
RBC: 4.41 x10E6/uL (ref 4.14–5.80)
RDW: 12.9 % (ref 11.6–15.4)
WBC: 6.2 10*3/uL (ref 3.4–10.8)

## 2019-12-20 LAB — LIPID PANEL
Chol/HDL Ratio: 2.6 ratio (ref 0.0–5.0)
Cholesterol, Total: 153 mg/dL (ref 100–199)
HDL: 59 mg/dL (ref >39)
LDL Chol Calc (NIH): 83 mg/dL (ref 0–99)
Triglycerides: 55 mg/dL (ref 0–149)
VLDL Cholesterol Cal: 11 mg/dL (ref 5–40)

## 2019-12-20 LAB — CMP14+EGFR
ALT: 16 IU/L (ref 0–44)
AST: 24 IU/L (ref 0–40)
Albumin/Globulin Ratio: 1.7 (ref 1.2–2.2)
Albumin: 4.5 g/dL (ref 3.8–4.8)
Alkaline Phosphatase: 75 IU/L (ref 48–121)
BUN/Creatinine Ratio: 13 (ref 10–24)
BUN: 11 mg/dL (ref 8–27)
Bilirubin Total: 0.4 mg/dL (ref 0.0–1.2)
CO2: 23 mmol/L (ref 20–29)
Calcium: 10 mg/dL (ref 8.6–10.2)
Chloride: 103 mmol/L (ref 96–106)
Creatinine, Ser: 0.86 mg/dL (ref 0.76–1.27)
GFR calc Af Amer: 108 mL/min/{1.73_m2} (ref >59)
GFR calc non Af Amer: 94 mL/min/{1.73_m2} (ref >59)
Globulin, Total: 2.6 g/dL (ref 1.5–4.5)
Glucose: 69 mg/dL (ref 65–99)
Potassium: 5 mmol/L (ref 3.5–5.2)
Sodium: 138 mmol/L (ref 134–144)
Total Protein: 7.1 g/dL (ref 6.0–8.5)

## 2019-12-21 ENCOUNTER — Other Ambulatory Visit: Payer: Self-pay | Admitting: Primary Care

## 2019-12-21 ENCOUNTER — Other Ambulatory Visit (INDEPENDENT_AMBULATORY_CARE_PROVIDER_SITE_OTHER): Payer: Self-pay | Admitting: Primary Care

## 2019-12-21 DIAGNOSIS — E785 Hyperlipidemia, unspecified: Secondary | ICD-10-CM

## 2019-12-21 MED ORDER — ATORVASTATIN CALCIUM 10 MG PO TABS: 10.0000 mg | ORAL_TABLET | Freq: Every day | ORAL | 3 refills | Status: DC

## 2019-12-22 MED FILL — ATORVASTATIN 10 MG TABLET: 10 | 30 days supply | Qty: 30 | Fill #0

## 2020-01-05 ENCOUNTER — Ambulatory Visit (INDEPENDENT_AMBULATORY_CARE_PROVIDER_SITE_OTHER): Payer: PRIVATE HEALTH INSURANCE

## 2020-01-05 ENCOUNTER — Ambulatory Visit (INDEPENDENT_AMBULATORY_CARE_PROVIDER_SITE_OTHER): Payer: PRIVATE HEALTH INSURANCE | Admitting: Orthopaedic Surgery

## 2020-01-05 ENCOUNTER — Encounter: Payer: Self-pay | Admitting: Orthopaedic Surgery

## 2020-01-05 VITALS — Ht 72.5 in | Wt 201.8 lb

## 2020-01-05 DIAGNOSIS — G8929 Other chronic pain: Secondary | ICD-10-CM | POA: Diagnosis not present

## 2020-01-05 DIAGNOSIS — M25562 Pain in left knee: Secondary | ICD-10-CM | POA: Diagnosis not present

## 2020-01-05 DIAGNOSIS — M1712 Unilateral primary osteoarthritis, left knee: Secondary | ICD-10-CM

## 2020-01-05 MED ORDER — LIDOCAINE HCL 1 % IJ SOLN
2.0000 mL | INTRAMUSCULAR | Status: AC | PRN
  Administered 2020-01-05: 2 mL

## 2020-01-05 MED ORDER — BUPIVACAINE HCL 0.5 % IJ SOLN
2.0000 mL | INTRAMUSCULAR | Status: AC | PRN
  Administered 2020-01-05: 2 mL via INTRA_ARTICULAR

## 2020-01-05 MED ORDER — METHYLPREDNISOLONE ACETATE 40 MG/ML IJ SUSP
40.0000 mg | INTRAMUSCULAR | Status: AC | PRN
  Administered 2020-01-05: 40 mg via INTRA_ARTICULAR

## 2020-01-05 NOTE — Progress Notes (Addendum)
Office Visit Note   Patient: Frank Landry           Date of Birth: 02-13-1961           MRN: [id number] Visit Date: 01/05/2020              Requested by: Grayce Sessions, NP [address] [place],  [place] [address] PCP: Grayce Sessions, NP   Assessment & Plan: Visit Diagnoses:  1. Primary osteoarthritis of left knee     Plan: Impression is moderately severe left knee DJD.  The x-ray findings were reviewed with the patient in detail.  His treatment options were discussed at length.  He would like to try cortisone injection today.  We have also given him a prescription for Voltaren gel.  He is likely leaning towards having a total knee replacement as a more permanent solution to his knee problems.  He is aiming for the end of the year.  He will follow back up with [place] closer to that time.  Follow-Up Instructions: Return for follow up in early november.   Orders:  Orders Placed This Encounter  Procedures  . XR KNEE 3 VIEW LEFT   No orders of the defined types were placed in this encounter.     Procedures: Large Joint Inj: L knee on 01/05/2020 8:36 AM Details: 22 G needle Medications: 2 mL bupivacaine 0.5 %; 2 mL lidocaine 1 %; 40 mg methylPREDNISolone acetate 40 MG/ML Outcome: tolerated well, no immediate complications Patient was prepped and draped in the usual sterile fashion.       Clinical Data: No additional findings.   Subjective: Chief Complaint  Patient presents with  . Left Knee - Pain    Frank Landry is a very pleasant 59 year old gentleman here for evaluation of chronic left knee pain for 20 years.  Denies any specific injuries.  He states that the pain is significantly limiting his function and quality of life.  He works at [employer].  He endorses constant popping and giving way with ambulation and activities.  He denies any swelling.  Denies any numbness and tingling or constitutional symptoms.  He has tried wearing a knee brace which  does not help.  He is a diabetic with most recent A1c of 5.9.   Review of Systems  Constitutional: Negative.   All other systems reviewed and are negative.    Objective: Vital Signs: Ht 6' 0.5" (1.842 m)   Wt 201 lb 12.8 oz (91.5 kg)   BMI 26.99 kg/m   Physical Exam Vitals and nursing note reviewed.  Constitutional:      Appearance: He is well-developed.  HENT:     Head: Normocephalic and atraumatic.  Eyes:     Pupils: Pupils are equal, round, and reactive to light.  Pulmonary:     Effort: Pulmonary effort is normal.  Abdominal:     Palpations: Abdomen is soft.  Musculoskeletal:        General: Normal range of motion.     Cervical back: Neck supple.  Skin:    General: Skin is warm.  Neurological:     Mental Status: He is alert and oriented to person, place, and time.  Psychiatric:        Behavior: Behavior normal.        Thought Content: Thought content normal.        Judgment: Judgment normal.     Ortho Exam Left knee shows a trace joint effusion.  Pain and crepitus  with range of motion.  Collaterals and cruciates are stable.  No significant tenderness palpation. Specialty Comments:  No specialty comments available.  Imaging: XR KNEE 3 VIEW LEFT  Result Date: 01/05/2020 Moderately severe tricompartmental DJD    PMFS History: Patient Active Problem List   Diagnosis Date Noted  . Hyponatremia 02/26/2012  . Impaired fasting glucose 02/26/2012  . Adverse drug reaction to Zosyn (rash) 02/26/2012  . Cellulitis 02/25/2012   Past Medical History:  Diagnosis Date  . No pertinent past medical history     Family History  Problem Relation Age of Onset  . Diabetes Mother   . Cancer Father     Past Surgical History:  Procedure Laterality Date  . HERNIA REPAIR    . NO PAST SURGERIES     Social History   Occupational History  . Not on file  Tobacco Use  . Smoking status: Former Smoker    Packs/day: 0.25    Years: 38.00    Pack years: 9.50    Types:  Cigarettes  . Smokeless tobacco: Never Used  . Tobacco comment: quit 4 years ago   Substance and Sexual Activity  . Alcohol use: No  . Drug use: No    Types: Marijuana    Comment: every 2 weeks- Denies 10/08/15  . Sexual activity: Not on file

## 2020-01-12 MED FILL — ATORVASTATIN 10 MG TABLET: 10 | 30 days supply | Qty: 30 | Fill #0

## 2020-01-25 MED FILL — LISINOPRIL 2.5 MG TABLET: 2.5 | 30 days supply | Qty: 30 | Fill #1

## 2020-01-30 ENCOUNTER — Other Ambulatory Visit: Payer: Self-pay

## 2020-01-30 ENCOUNTER — Encounter (HOSPITAL_COMMUNITY): Payer: Self-pay

## 2020-01-30 ENCOUNTER — Ambulatory Visit (HOSPITAL_COMMUNITY)
Admission: EM | Admit: 2020-01-30 | Discharge: 2020-01-30 | Disposition: A | Discharge status: Home / Self Care | Payer: PRIVATE HEALTH INSURANCE | Attending: Family Medicine | Admitting: Family Medicine

## 2020-01-30 DIAGNOSIS — E11628 Type 2 diabetes mellitus with other skin complications: Secondary | ICD-10-CM | POA: Diagnosis not present

## 2020-01-30 DIAGNOSIS — Z23 Encounter for immunization: Secondary | ICD-10-CM

## 2020-01-30 DIAGNOSIS — L089 Local infection of the skin and subcutaneous tissue, unspecified: Secondary | ICD-10-CM | POA: Diagnosis not present

## 2020-01-30 MED ORDER — TETANUS-DIPHTH-ACELL PERTUSSIS 5-2.5-18.5 LF-MCG/0.5 IM SUSP
INTRAMUSCULAR | Status: AC
  Filled 2020-01-30: qty 0.5

## 2020-01-30 MED ORDER — TETANUS-DIPHTH-ACELL PERTUSSIS 5-2.5-18.5 LF-MCG/0.5 IM SUSP
0.5000 mL | Freq: Once | INTRAMUSCULAR | Status: AC
  Administered 2020-01-30: 0.5 mL via INTRAMUSCULAR

## 2020-01-30 MED ORDER — SULFAMETHOXAZOLE-TRIMETHOPRIM 800-160 MG PO TABS: 1.0000 | ORAL_TABLET | Freq: Two times a day (BID) | ORAL | 0 refills | Status: AC

## 2020-01-30 MED ORDER — IBUPROFEN 800 MG PO TABS
800.0000 mg | ORAL_TABLET | Freq: Three times a day (TID) | ORAL | 0 refills | Status: DC
Start: 2020-01-30 — End: 2020-03-21

## 2020-01-30 NOTE — Discharge Instructions (Signed)
Take the antibiotic 2 x a day Wash every day and apply antibiotic ointment and bandage Take ibuprofen 3 x a day with food - as needed pain See your PCP in a week or so

## 2020-01-30 NOTE — ED Triage Notes (Signed)
Pt presents with swelling and pain in the left foot for the past 2 weeks. States he has some pieces of glass in the left heel.

## 2020-01-31 MED FILL — SILDENAFIL CITRATE 100 MG T: 100 | 30 days supply | Qty: 10 | Fill #1

## 2020-01-31 MED FILL — SULFAMETHOXAZOLE-TMP DS TAB: 800-160 | 7 days supply | Qty: 14 | Fill #0

## 2020-01-31 MED FILL — IBUPROFEN 800 MG TABLET: 800 | 7 days supply | Qty: 21 | Fill #0

## 2020-01-31 NOTE — ED Provider Notes (Signed)
[place]    CSN: [id number] Arrival date & time: 01/30/20  1621      History   Chief Complaint Chief Complaint  Patient presents with  . Foreign Body    HPI Frank Landry is a 59 y.o. male.   HPI  Patient states that he stepped on glass 2 weeks ago.  He states it got infected and he had a large blister of pus on his heel.  His girlfriend opened this with a needle and drained some of the pus.  In spite of this he still has pain in his heel.  He is here for evaluation. 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 No pertinent past medical history     Patient Active Problem List   Diagnosis Date Noted  . Hyponatremia 02/26/2012  . Impaired fasting glucose 02/26/2012  . Adverse drug reaction to Zosyn (rash) 02/26/2012  . Cellulitis 02/25/2012    Past Surgical History:  Procedure Laterality Date  . HERNIA REPAIR    . NO PAST SURGERIES         Home Medications    Prior to Admission medications   Medication Sig Start Date End Date Taking? Authorizing Provider  atorvastatin (LIPITOR) 10 MG tablet Take 1 tablet (10 mg total) by mouth daily. 12/21/19   Kerin Perna, NP  Colloidal Oatmeal (EUCERIN ECZEMA RELIEF) 1 % CREA Apply 156 g topically 2 (two) times daily as needed. 12/19/19   Kerin Perna, NP  glucose monitoring kit (FREESTYLE) monitoring kit 1 each by Does not apply route as needed for other. Please include lancet and test strip . Testing once daily 03/16/19   Kerin Perna, NP  ibuprofen (ADVIL) 800 MG tablet Take 1 tablet (800 mg total) by mouth 3 (three) times daily. 01/30/20   Raylene Everts, MD  lisinopril (ZESTRIL) 2.5 MG tablet Take 1 tablet (2.5 mg total) by mouth daily. 12/19/19   Kerin Perna, NP  senna (SENOKOT) 8.6 MG TABS tablet Take 1 tablet (8.6 mg total) by mouth daily. 03/23/19   Kerin Perna, NP  sildenafil (VIAGRA) 100 MG tablet Take 0.5-1 tablets (50-100 mg total) by mouth daily as needed for erectile dysfunction. 12/19/19   Kerin Perna, NP  sitaGLIPtin-metformin (JANUMET) 50-1000 MG tablet Take 1 tablet by mouth 2 (two) times daily with a meal. 06/20/19   Kerin Perna, NP  sulfamethoxazole-trimethoprim (BACTRIM DS) 800-160 MG tablet Take 1 tablet by mouth 2 (two) times daily for 7 days. 01/30/20 02/06/20  Raylene Everts, MD    Family History Family History  Problem Relation Age of Onset  . Diabetes Mother   . Cancer Father     Social History Social History   Tobacco Use  . Smoking status: Former Smoker    Packs/day: 0.25    Years: 38.00    Pack years: 9.50    Types: Cigarettes  . Smokeless tobacco: Never Used  . Tobacco comment: quit 4 years ago   Substance Use Topics  . Alcohol use: No  . Drug use: No    Types: Marijuana    Comment: every 2 weeks- Denies 10/08/15     Allergies   Zosyn [piperacillin sod-tazobactam  so]   Review of Systems Review of Systems See HPI  Physical Exam Triage Vital Signs ED Triage Vitals  Enc Vitals Group     BP 01/30/20 1814 128/84     Pulse Rate 01/30/20 1814 72     Resp 01/30/20 1814 20     Temp 01/30/20 1814 98.2 F (36.8 C)     Temp Source 01/30/20 1814 Oral     SpO2 01/30/20 1814 100 %     Weight --      Height --      Head Circumference --      Peak Flow --      Pain Score 01/30/20 1813 6     Pain Loc --      Pain Edu? --      Excl. in [place]? --    No data found.  Updated Vital Signs BP 128/84 (BP Location: Right Arm)   Pulse 72   Temp 98.2 F (36.8 C) (Oral)   Resp 20   SpO2 100%      Physical Exam Constitutional:      General: He is not in acute distress.    Appearance: He is well-developed.  HENT:     Head: Normocephalic and atraumatic.  Eyes:      Conjunctiva/sclera: Conjunctivae normal.     Pupils: Pupils are equal, round, and reactive to light.  Cardiovascular:     Rate and Rhythm: Normal rate.  Pulmonary:     Effort: Pulmonary effort is normal. No respiratory distress.  Musculoskeletal:        General: Normal range of motion.     Cervical back: Normal range of motion.  Skin:    General: Skin is warm and dry.     Comments: The heel the right foot has a large blister with some purulence and bleeding visible through the skin.  It is debrided.  It is 5 cm across.  There is a small ulceration central.  This is probed, with no sharp sensation and no glass found.  Dressing placed  Neurological:     Mental Status: He is alert.     Gait: Gait abnormal.      UC Treatments / Results  Labs (all labs ordered are listed, but only abnormal results are displayed) Labs Reviewed - No data to display  EKG   Radiology No results found.  Procedures Procedures (including critical care time)  Medications Ordered in UC Medications  Tdap (BOOSTRIX) injection 0.5 mL (0.5 mLs Intramuscular Given 01/30/20 1911)    Initial Impression / Assessment and Plan / UC Course  I have reviewed the triage vital signs and the nursing notes.  Pertinent labs & imaging results that were available during my care of the patient were reviewed by me and considered in my medical decision making (see chart for details).     *No foreign body but he does have a small ulceration in his heel.  This is a diabetic wound and will take some time to heal.  Proper wound care is important.  This is discussed with patient. Final Clinical Impressions(s) / UC Diagnoses   Final diagnoses:  Diabetic foot infection ([place])     Discharge Instructions     Take the antibiotic 2 x a day Wash every day and apply antibiotic ointment and bandage Take ibuprofen 3 x a day with food - as needed pain See your PCP in a week or so   ED Prescriptions    Medication Sig Dispense  Auth. Provider   sulfamethoxazole-trimethoprim (BACTRIM DS) 800-160 MG tablet Take 1 tablet by mouth 2 (two) times daily for 7 days. 14 tablet Raylene Everts, MD   ibuprofen (ADVIL) 800 MG tablet Take 1 tablet (800 mg total) by mouth 3 (three) times daily. 21 tablet Raylene Everts, MD     PDMP not reviewed this encounter.   Raylene Everts, MD 01/31/20 [phone]

## 2020-03-01 MED FILL — ATORVASTATIN 10 MG TABLET: 10 | 30 days supply | Qty: 30 | Fill #1

## 2020-03-01 MED FILL — LISINOPRIL 2.5 MG TABLET: 2.5 | 30 days supply | Qty: 30 | Fill #2

## 2020-03-11 ENCOUNTER — Emergency Department (HOSPITAL_COMMUNITY): Payer: PRIVATE HEALTH INSURANCE

## 2020-03-11 ENCOUNTER — Other Ambulatory Visit: Payer: Self-pay

## 2020-03-11 ENCOUNTER — Encounter (HOSPITAL_COMMUNITY): Payer: Self-pay | Admitting: Emergency Medicine

## 2020-03-11 ENCOUNTER — Emergency Department (HOSPITAL_COMMUNITY)
Admission: EM | Admit: 2020-03-11 | Discharge: 2020-03-11 | Disposition: A | Discharge status: Home / Self Care | Payer: PRIVATE HEALTH INSURANCE | Attending: Emergency Medicine | Admitting: Emergency Medicine

## 2020-03-11 ENCOUNTER — Other Ambulatory Visit: Payer: Self-pay | Admitting: Emergency Medicine

## 2020-03-11 DIAGNOSIS — E119 Type 2 diabetes mellitus without complications: Secondary | ICD-10-CM | POA: Diagnosis not present

## 2020-03-11 DIAGNOSIS — R0781 Pleurodynia: Secondary | ICD-10-CM | POA: Diagnosis present

## 2020-03-11 DIAGNOSIS — Z87891 Personal history of nicotine dependence: Secondary | ICD-10-CM | POA: Insufficient documentation

## 2020-03-11 DIAGNOSIS — Z7984 Long term (current) use of oral hypoglycemic drugs: Secondary | ICD-10-CM | POA: Insufficient documentation

## 2020-03-11 DIAGNOSIS — W228XXA Striking against or struck by other objects, initial encounter: Secondary | ICD-10-CM | POA: Insufficient documentation

## 2020-03-11 DIAGNOSIS — L02212 Cutaneous abscess of back [any part, except buttock]: Secondary | ICD-10-CM | POA: Insufficient documentation

## 2020-03-11 DIAGNOSIS — L0291 Cutaneous abscess, unspecified: Secondary | ICD-10-CM

## 2020-03-11 HISTORY — DX: Type 2 diabetes mellitus without complications: E11.9

## 2020-03-11 MED ORDER — SULFAMETHOXAZOLE-TRIMETHOPRIM 800-160 MG PO TABS: 1.0000 | ORAL_TABLET | Freq: Two times a day (BID) | ORAL | 0 refills | Status: AC

## 2020-03-11 NOTE — ED Notes (Signed)
Reddened area to left side area at lower ribcage- no drainage, but is painful- has another area right lower flank area

## 2020-03-11 NOTE — ED Triage Notes (Signed)
Pt reports large "knot" on L lateral side.  Denies fever and chills.

## 2020-03-11 NOTE — Discharge Instructions (Signed)
Please return for any problem.  Follow-up with your regular care provider as instructed.  Apply warm compresses to the affected area.  Make sure that you express any purulence from the abscess.
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
## 2020-03-13 MED FILL — SILDENAFIL CITRATE 100 MG T: 100 | 30 days supply | Qty: 10 | Fill #2

## 2020-03-13 MED FILL — SULFAMETHOXAZOLE-TMP DS TAB: 800-160 | 7 days supply | Qty: 14 | Fill #0

## 2020-03-21 ENCOUNTER — Inpatient Hospital Stay (INDEPENDENT_AMBULATORY_CARE_PROVIDER_SITE_OTHER): Payer: PRIVATE HEALTH INSURANCE | Admitting: Primary Care

## 2020-03-21 ENCOUNTER — Other Ambulatory Visit: Payer: Self-pay | Admitting: Primary Care

## 2020-03-21 ENCOUNTER — Other Ambulatory Visit: Payer: Self-pay

## 2020-03-21 ENCOUNTER — Ambulatory Visit (INDEPENDENT_AMBULATORY_CARE_PROVIDER_SITE_OTHER): Payer: Self-pay | Admitting: Primary Care

## 2020-03-21 ENCOUNTER — Encounter (INDEPENDENT_AMBULATORY_CARE_PROVIDER_SITE_OTHER): Payer: Self-pay | Admitting: Primary Care

## 2020-03-21 VITALS — BP 127/85 | HR 76 | Temp 97.5°F | Ht 73.0 in | Wt 192.6 lb

## 2020-03-21 DIAGNOSIS — Z760 Encounter for issue of repeat prescription: Secondary | ICD-10-CM

## 2020-03-21 DIAGNOSIS — I739 Peripheral vascular disease, unspecified: Secondary | ICD-10-CM | POA: Insufficient documentation

## 2020-03-21 DIAGNOSIS — Z23 Encounter for immunization: Secondary | ICD-10-CM

## 2020-03-21 DIAGNOSIS — E119 Type 2 diabetes mellitus without complications: Secondary | ICD-10-CM

## 2020-03-21 DIAGNOSIS — Z09 Encounter for follow-up examination after completed treatment for conditions other than malignant neoplasm: Secondary | ICD-10-CM

## 2020-03-21 DIAGNOSIS — L0291 Cutaneous abscess, unspecified: Secondary | ICD-10-CM

## 2020-03-21 MED ORDER — HYDROXYZINE HCL 10 MG PO TABS: 10.0000 mg | ORAL_TABLET | Freq: Three times a day (TID) | ORAL | 1 refills | Status: DC | PRN

## 2020-03-21 MED ORDER — METFORMIN HCL ER 500 MG PO TB24: 500.0000 mg | ORAL_TABLET | Freq: Every day | ORAL | 1 refills | Status: DC

## 2020-03-21 NOTE — Progress Notes (Signed)
Established Patient Office Visit  Subjective:  Patient ID: Frank Landry, male    DOB: 04-09-1961  Age: 59 y.o. MRN: [id number]  CC:  Chief Complaint  Patient presents with  . Hospitalization Follow-up    lump on side     HPI Mr. Frank Landry presents for 59 y.o.male presents for follow up from the emergency room on 03/11/20, patient was discharged on the same day with diagnosis of abscess-he complained of pain left lateral with.  This area hurts with movement or touch.  Past Medical History:  Diagnosis Date  . Diabetes mellitus without complication ([place])   . No pertinent past medical history     Past Surgical History:  Procedure Laterality Date  . HERNIA REPAIR    . NO PAST SURGERIES      Family History  Problem Relation Age of Onset  . Diabetes Mother   . Cancer Father     Social History   Socioeconomic History  . Marital status: Married    Spouse name: Not on file  . Number of children: Not on file  . Years of education: Not on file  . Highest education level: Not on file  Occupational History  . Not on file  Tobacco Use  . Smoking status: Former Smoker    Packs/day: 0.25    Years: 38.00    Pack years: 9.50    Types: Cigarettes  . Smokeless tobacco: Never Used  . Tobacco comment: quit 4 years ago   Substance and Sexual Activity  . Alcohol use: No  . Drug use: No    Types: Marijuana    Comment: every 2 weeks- Denies 10/08/15  . Sexual activity: Not on file  Other Topics Concern  . Not on file  Social History Narrative  . Not on file   Social Determinants of Health   Financial Resource Strain:   . Difficulty of Paying Living Expenses: Not on file  Food Insecurity:   . Worried About Charity fundraiser in the Last Year: Not on file  . Ran Out of Food in the Last Year: Not on file  Transportation Needs:   . Lack of Transportation (Medical): Not on file  . Lack of Transportation (Non-Medical): Not on file  Physical Activity:    . Days of Exercise per Week: Not on file  . Minutes of Exercise per Session: Not on file  Stress:   . Feeling of Stress : Not on file  Social Connections:   . Frequency of Communication with Friends and Family: Not on file  . Frequency of Social Gatherings with Friends and Family: Not on file  . Attends Religious Services: Not on file  . Active Member of Clubs or Organizations: Not on file  . Attends Archivist Meetings: Not on file  . Marital Status: Not on file  Intimate Partner Violence:   . Fear of Current or Ex-Partner: Not on file  . Emotionally Abused: Not on file  . Physically Abused: Not on file  . Sexually Abused: Not on file    Outpatient Medications Prior to Visit  Medication Sig Dispense Refill  . atorvastatin (LIPITOR) 10 MG tablet Take 1 tablet (10 mg total) by mouth daily. 90 tablet 3  . Colloidal Oatmeal (EUCERIN ECZEMA RELIEF) 1 % CREA Apply 156 g topically 2 (two) times daily as needed. 156 g 2  . sildenafil (VIAGRA) 100 MG tablet Take 0.5-1 tablets (50-100 mg total) by mouth daily as needed for  erectile dysfunction. 5 tablet 11  . glucose monitoring kit (FREESTYLE) monitoring kit 1 each by Does not apply route as needed for other. Please include lancet and test strip . Testing once daily (Patient not taking: Reported on 03/21/2020) 1 each 0  . lisinopril (ZESTRIL) 2.5 MG tablet Take 1 tablet (2.5 mg total) by mouth daily. (Patient not taking: Reported on 03/21/2020) 90 tablet 1  . ibuprofen (ADVIL) 800 MG tablet Take 1 tablet (800 mg total) by mouth 3 (three) times daily. 21 tablet 0  . senna (SENOKOT) 8.6 MG TABS tablet Take 1 tablet (8.6 mg total) by mouth daily. 120 tablet 0  . sitaGLIPtin-metformin (JANUMET) 50-1000 MG tablet Take 1 tablet by mouth 2 (two) times daily with a meal. (Patient not taking: Reported on 03/21/2020) 180 tablet 1   No facility-administered medications prior to visit.    Allergies  Allergen Reactions  . Zosyn [Piperacillin  Sod-Tazobactam So] Rash    ROS Review of Systems  Skin:       abscess to the right posterior low back   All other systems reviewed and are negative.     Objective:    Physical Exam BP 127/85 (BP Location: Right Arm, Patient Position: Sitting, Cuff Size: Large)   Pulse 76   Temp (!) 97.5 F (36.4 C) (Temporal)   Ht 6' 1" (1.854 m)   Wt 192 lb 9.6 oz (87.4 kg)   SpO2 98%   BMI 25.41 kg/m  General Appearance: Well nourished, in no apparent distress. Eyes: PERRLA, EOMs, conjunctiva no swelling or erythema Sinuses: No Frontal/maxillary tenderness ENT/Mouth: Ext aud canals clear, TMs without  Hearing normal.  Neck: Supple, thyroid normal.  Respiratory: Respiratory effort normal, BS equal bilaterally without rales, rhonchi, wheezing or stridor.  Cardio: RRR with no MRGs. Brisk peripheral pulses without edema.  Abdomen: Soft, + BS.  Non tender, no guarding, rebound, hernias, masses. Lymphatics: Non tender without lymphadenopathy.  Musculoskeletal: Full ROM, 5/5 strength, normal gait.  Skin: Warm, dry abscess to the right posterior low back   Neuro: Cranial nerves intact. Normal muscle tone, no cerebellar symptoms. Sensation intact.  Psych: Awake and oriented X 3, normal affect, Insight and Judgment appropriate.    There are no preventive care reminders to display for this patient.  There are no preventive care reminders to display for this patient.  Lab Results  Component Value Date   TSH 2.310 04/13/2017   Lab Results  Component Value Date   WBC 6.2 12/19/2019   HGB 14.3 12/19/2019   HCT 42.3 12/19/2019   MCV 96 12/19/2019   PLT 223 12/19/2019   Lab Results  Component Value Date   NA 138 12/19/2019   K 5.0 12/19/2019   CO2 23 12/19/2019   GLUCOSE 69 12/19/2019   BUN 11 12/19/2019   CREATININE 0.86 12/19/2019   BILITOT 0.4 12/19/2019   ALKPHOS 75 12/19/2019   AST 24 12/19/2019   ALT 16 12/19/2019   PROT 7.1 12/19/2019   ALBUMIN 4.5 12/19/2019   CALCIUM  10.0 12/19/2019   ANIONGAP 8 07/26/2018   Lab Results  Component Value Date   CHOL 153 12/19/2019   Lab Results  Component Value Date   HDL 59 12/19/2019   Lab Results  Component Value Date   LDLCALC 83 12/19/2019   Lab Results  Component Value Date   TRIG 55 12/19/2019   Lab Results  Component Value Date   CHOLHDL 2.6 12/19/2019   Lab Results  Component Value Date  HGBA1C 5.9 (A) 12/19/2019      Assessment & Plan:   Lynell was seen today for hospitalization follow-up.  Diagnoses and all orders for this visit:  Kiyon was seen today for hospitalization follow-up.  Diagnoses and all orders for this visit:  Need for immunization against influenza -     Flu Vaccine QUAD 36+ mos IM  Type 2 diabetes mellitus without complication, without long-term current use of insulin (HCC) A1c 12/19/2019 5.9 indicating prediabetes currently on monotherapy -     metFORMIN (GLUCOPHAGE XR) 500 MG 24 hr tablet; Take 1 tablet (500 mg total) by mouth daily with breakfast.  Hospital discharge follow-up Unilateral chest x-ray with left with completed no acute displaced rib fractures Retrieved from hospital/emergency room visit  Kerin Perna, NP (Internal Medicine) in 3 days (03/14/2020)  Medication refill -     metFORMIN (GLUCOPHAGE XR) 500 MG 24 hr tablet; Take 1 tablet (500 mg total) by mouth daily with breakfast.  Abscess Treated in emergency room with Septra DS -     hydrOXYzine (ATARAX/VISTARIL) 10 MG tablet; Take 1 tablet (10 mg total) by mouth every 8 (eight) hours as needed for itching.    Other orders -     metFORMIN (GLUCOPHAGE XR) 500 MG 24 hr tablet; Take 1 tablet (500 mg total) by mouth daily with breakfast. -     hydrOXYzine (ATARAX/VISTARIL) 10 MG tablet; Take 1 tablet (10 mg total) by mouth every 8 (eight) hours as needed for itching.    Meds ordered this encounter  Medications  . metFORMIN (GLUCOPHAGE XR) 500 MG 24 hr tablet    Sig: Take 1  tablet (500 mg total) by mouth daily with breakfast.    Dispense:  90 tablet    Refill:  1  . hydrOXYzine (ATARAX/VISTARIL) 10 MG tablet    Sig: Take 1 tablet (10 mg total) by mouth every 8 (eight) hours as needed for itching.    Dispense:  60 tablet    Refill:  1    Follow-up: Return in about 6 months (around 09/19/2020) for DM.    Kerin Perna, NP  Assessment and Plan:  Current Outpatient Medications on File Prior to Visit  Medication Sig Dispense Refill  . atorvastatin (LIPITOR) 10 MG tablet Take 1 tablet (10 mg total) by mouth daily. 90 tablet 3  . Colloidal Oatmeal (EUCERIN ECZEMA RELIEF) 1 % CREA Apply 156 g topically 2 (two) times daily as needed. 156 g 2  . sildenafil (VIAGRA) 100 MG tablet Take 0.5-1 tablets (50-100 mg total) by mouth daily as needed for erectile dysfunction. 5 tablet 11  . glucose monitoring kit (FREESTYLE) monitoring kit 1 each by Does not apply route as needed for other. Please include lancet and test strip . Testing once daily (Patient not taking: Reported on 03/21/2020) 1 each 0  . lisinopril (ZESTRIL) 2.5 MG tablet Take 1 tablet (2.5 mg total) by mouth daily. (Patient not taking: Reported on 03/21/2020) 90 tablet 1   No current facility-administered medications on file prior to visit.      Kerin Perna, NP 11:20 PM

## 2020-03-21 NOTE — Patient Instructions (Signed)
Preventing Influenza, Adult Influenza, more commonly known as "the flu," is a viral infection that mainly affects the respiratory tract. The respiratory tract includes structures that help you breathe, such as the lungs, nose, and throat. The flu causes many common cold symptoms, as well as a high fever and body aches. The flu spreads easily from person to person (is contagious). The flu is most common from December through March. This is called flu season.You can catch the flu virus by:  Breathing in droplets from an infected person's cough or sneeze.  Touching something that was recently contaminated with the virus and then touching your mouth, nose, or eyes. What can I do to lower my risk?        You can decrease your risk of getting the flu by:  Getting a flu shot (influenza vaccination) every year. This is the best way to prevent the flu. A flu shot is recommended for everyone age 6 months and older. ? It is best to get a flu shot in the fall, as soon as it is available. Getting a flu shot during winter or spring instead is still a good idea. Flu season can last into early spring. ? Preventing the flu through vaccination requires getting a new flu shot every year. This is because the flu virus changes slightly (mutates) from one year to the next. Even if a flu shot does not completely protect you from all flu virus mutations, it can reduce the severity of your illness and prevent dangerous complications of the flu. ? If you are pregnant, you can and should get a flu shot. ? If you have had a reaction to the shot in the past or if you are allergic to eggs, check with your health care provider before getting a flu shot. ? Sometimes the vaccine is available as a nasal spray. In some years, the nasal spray has not been as effective against the flu virus. Check with your health care provider if you have questions about this.  Practicing good health habits. This is especially important during  flu season. ? Avoid contact with people who are sick with flu or cold symptoms. ? Wash your hands with soap and water often. If soap and water are not available, use alcohol-based hand sanitizer. ? Avoid touching your hands to your face, especially when you have not washed your hands recently. ? Use a disinfectant to clean surfaces at home and at work that may be contaminated with the flu virus. ? Keep your body's disease-fighting system (immune system) in good shape by eating a healthy diet, drinking plenty of fluids, getting enough sleep, and exercising regularly. If you do get the flu, avoid spreading it to others by:  Staying home until your symptoms have been gone for at least one day.  Covering your mouth and nose when you cough or sneeze.  Avoiding close contact with others, especially babies and elderly people. Why are these changes important? Getting a flu shot and practicing good health habits protects you as well as other people. If you get the flu, your friends, family, and co-workers are also at risk of getting it, because it spreads so easily to others. Each year, about 2 out of every 10 people get the flu. Having the flu can lead to complications, such as pneumonia, ear infection, and sinus infection. The flu also can be deadly, especially for babies, people older than age 65, and people who have serious long-term diseases. How is this treated? Most   people recover from the flu by resting at home and drinking plenty of fluids. However, a prescription antiviral medicine may reduce your flu symptoms and may make your flu go away sooner. This medicine must be started within a few days of getting flu symptoms. You can talk with your health care provider about whether you need an antiviral medicine. Antiviral medicine may be prescribed for people who are at risk for more serious flu symptoms. This includes people who:  Are older than age 65.  Are pregnant.  Have a condition that  makes the flu worse or more dangerous. Where to find more information  Centers for Disease Control and Prevention: www.cdc.gov/flu/index.htm  Flu.gov: www.flu.gov/prevention-vaccination  American Academy of Family Physicians: familydoctor.org/familydoctor/en/kids/vaccines/preventing-the-flu.html Contact a health care provider if:  You have influenza and you develop new symptoms.  You have: ? Chest pain. ? Diarrhea. ? A fever.  Your cough gets worse, or you produce more mucus. Summary  The best way to prevent the flu is to get a flu shot every year in the fall.  Even if you get the flu after you have received the yearly vaccine, your flu may be milder and go away sooner because of your flu shot.  If you get the flu, antiviral medicines that are started with a few days of symptoms may reduce your flu symptoms and may make your flu go away sooner.  You can also help prevent the flu by practicing good health habits. This information is not intended to replace advice given to you by your health care provider. Make sure you discuss any questions you have with your health care provider. Document Revised: 05/01/2017 Document Reviewed: 01/26/2016 Elsevier Patient Education  2020 Elsevier Inc.  

## 2020-03-21 NOTE — Progress Notes (Signed)
Pt wants glipizide refilled

## 2020-03-22 MED FILL — hydrOXYzine HCL 10 MG TABS: 10 | 20 days supply | Qty: 60 | Fill #0

## 2020-03-22 MED FILL — metFORMIN HCL ER 500 MG TB2: 500 | 30 days supply | Qty: 30 | Fill #0

## 2020-04-04 MED FILL — ATORVASTATIN 10 MG TABLET: 10 | 30 days supply | Qty: 30 | Fill #2

## 2020-04-04 MED FILL — LISINOPRIL 2.5 MG TABLET: 2.5 | 30 days supply | Qty: 30 | Fill #3

## 2020-04-16 MED FILL — hydrOXYzine HCL 10 MG TABS: 10 | 20 days supply | Qty: 60 | Fill #1

## 2020-04-16 MED FILL — metFORMIN HCL ER 500 MG TB2: 500 | 30 days supply | Qty: 30 | Fill #1

## 2020-05-14 MED FILL — metFORMIN HCL ER 500 MG TB2: 500 | 30 days supply | Qty: 30 | Fill #2

## 2020-05-14 MED FILL — LISINOPRIL 2.5 MG TABLET: 2.5 | 30 days supply | Qty: 30 | Fill #4

## 2020-05-21 MED FILL — ATORVASTATIN 10 MG TABLET: 10 | 30 days supply | Qty: 30 | Fill #3

## 2020-05-31 ENCOUNTER — Other Ambulatory Visit (INDEPENDENT_AMBULATORY_CARE_PROVIDER_SITE_OTHER): Payer: Self-pay | Admitting: Primary Care

## 2020-05-31 ENCOUNTER — Other Ambulatory Visit: Payer: Self-pay | Admitting: Primary Care

## 2020-05-31 DIAGNOSIS — L0291 Cutaneous abscess, unspecified: Secondary | ICD-10-CM

## 2020-05-31 MED FILL — hydrOXYzine HCL 10 MG TABS: 10 | 20 days supply | Qty: 60 | Fill #0

## 2020-05-31 MED FILL — SILDENAFIL CITRATE 100 MG T: 100 | 30 days supply | Qty: 10 | Fill #3

## 2020-06-14 MED FILL — LISINOPRIL 2.5 MG TABLET: 2.5 | 30 days supply | Qty: 30 | Fill #5

## 2020-06-14 MED FILL — metFORMIN HCL ER 500 MG TB2: 500 | 30 days supply | Qty: 30 | Fill #3

## 2020-07-18 ENCOUNTER — Other Ambulatory Visit: Payer: Self-pay | Admitting: Primary Care

## 2020-07-18 ENCOUNTER — Other Ambulatory Visit (INDEPENDENT_AMBULATORY_CARE_PROVIDER_SITE_OTHER): Payer: Self-pay | Admitting: Primary Care

## 2020-07-18 MED FILL — metFORMIN HCL ER 500 MG TB2: 500 | 30 days supply | Qty: 30 | Fill #4

## 2020-07-18 MED FILL — hydrOXYzine HCL 10 MG TABS: 10 | 20 days supply | Qty: 60 | Fill #1

## 2020-07-18 MED FILL — ATORVASTATIN 10 MG TABLET: 10 | 30 days supply | Qty: 30 | Fill #4

## 2020-07-18 MED FILL — LISINOPRIL 2.5 MG TABLET: 2.5 | 30 days supply | Qty: 30 | Fill #0

## 2020-07-26 MED FILL — SILDENAFIL CITRATE 100 MG T: 100 | 30 days supply | Qty: 10 | Fill #4

## 2020-08-21 ENCOUNTER — Other Ambulatory Visit (INDEPENDENT_AMBULATORY_CARE_PROVIDER_SITE_OTHER): Payer: Self-pay | Admitting: Primary Care

## 2020-08-21 ENCOUNTER — Other Ambulatory Visit: Payer: Self-pay | Admitting: Primary Care

## 2020-08-21 DIAGNOSIS — L0291 Cutaneous abscess, unspecified: Secondary | ICD-10-CM

## 2020-08-21 MED FILL — hydrOXYzine HCL 10 MG TABS: 10 | 20 days supply | Qty: 60 | Fill #0

## 2020-08-21 MED FILL — ATORVASTATIN 10 MG TABLET: 10 | 30 days supply | Qty: 30 | Fill #5

## 2020-08-21 MED FILL — LISINOPRIL 2.5 MG TABLET: 2.5 | 30 days supply | Qty: 30 | Fill #1

## 2020-08-21 MED FILL — metFORMIN HCL ER 500 MG TB2: 500 | 30 days supply | Qty: 30 | Fill #5

## 2020-09-26 ENCOUNTER — Other Ambulatory Visit: Payer: Self-pay

## 2020-09-26 ENCOUNTER — Ambulatory Visit (INDEPENDENT_AMBULATORY_CARE_PROVIDER_SITE_OTHER): Payer: PRIVATE HEALTH INSURANCE | Admitting: Primary Care

## 2020-09-26 ENCOUNTER — Encounter (INDEPENDENT_AMBULATORY_CARE_PROVIDER_SITE_OTHER): Payer: Self-pay | Admitting: Primary Care

## 2020-09-26 VITALS — BP 128/84 | HR 77 | Temp 97.3°F | Ht 73.0 in | Wt 206.2 lb

## 2020-09-26 DIAGNOSIS — E785 Hyperlipidemia, unspecified: Secondary | ICD-10-CM

## 2020-09-26 DIAGNOSIS — E119 Type 2 diabetes mellitus without complications: Secondary | ICD-10-CM

## 2020-09-26 DIAGNOSIS — L309 Dermatitis, unspecified: Secondary | ICD-10-CM

## 2020-09-26 DIAGNOSIS — F5221 Male erectile disorder: Secondary | ICD-10-CM

## 2020-09-26 DIAGNOSIS — M25562 Pain in left knee: Secondary | ICD-10-CM | POA: Diagnosis not present

## 2020-09-26 DIAGNOSIS — I10 Essential (primary) hypertension: Secondary | ICD-10-CM

## 2020-09-26 DIAGNOSIS — G8929 Other chronic pain: Secondary | ICD-10-CM

## 2020-09-26 DIAGNOSIS — K5900 Constipation, unspecified: Secondary | ICD-10-CM

## 2020-09-26 DIAGNOSIS — Z760 Encounter for issue of repeat prescription: Secondary | ICD-10-CM

## 2020-09-26 LAB — POCT GLYCOSYLATED HEMOGLOBIN (HGB A1C): Hemoglobin A1C: 7.4 % — AB (ref 4.0–5.6)

## 2020-09-26 MED ORDER — PSYLLIUM 28 % PO PACK
1.0000 | PACK | Freq: Two times a day (BID) | ORAL | 6 refills | Status: DC
  Filled 2020-09-26: qty 60, 30d supply, fill #0

## 2020-09-26 MED ORDER — SILDENAFIL CITRATE 100 MG PO TABS
ORAL_TABLET | ORAL | 11 refills | Status: DC
  Filled 2020-09-26: qty 5, 15d supply, fill #0
  Filled 2020-10-23: qty 5, 15d supply, fill #1
  Filled 2020-12-04: qty 10, 30d supply, fill #2
  Filled 2021-01-09: qty 10, 30d supply, fill #3
  Filled 2021-02-11: qty 10, 30d supply, fill #4
  Filled 2021-03-18: qty 10, 30d supply, fill #5
  Filled 2021-08-28: qty 10, 30d supply, fill #0

## 2020-09-26 MED ORDER — HYDROXYZINE HCL 10 MG PO TABS
ORAL_TABLET | ORAL | 1 refills | Status: DC
  Filled 2020-09-26: qty 60, 20d supply, fill #0
  Filled 2020-12-04: qty 60, 20d supply, fill #1

## 2020-09-26 MED ORDER — ATORVASTATIN CALCIUM 10 MG PO TABS
ORAL_TABLET | Freq: Every day | ORAL | 3 refills | Status: DC
  Filled 2020-09-26: qty 30, 30d supply, fill #0
  Filled 2020-10-23: qty 30, 30d supply, fill #1
  Filled 2020-12-04: qty 30, 30d supply, fill #2
  Filled 2021-01-09: qty 30, 30d supply, fill #3
  Filled 2021-03-18: qty 30, 30d supply, fill #4
  Filled 2021-05-01: qty 30, 30d supply, fill #5

## 2020-09-26 MED ORDER — METFORMIN HCL 1000 MG PO TABS
1000.0000 mg | ORAL_TABLET | Freq: Two times a day (BID) | ORAL | 3 refills | Status: DC
  Filled 2020-09-26: qty 60, 30d supply, fill #0
  Filled 2020-11-01: qty 60, 30d supply, fill #1
  Filled 2020-12-06: qty 60, 30d supply, fill #2
  Filled 2021-01-09: qty 60, 30d supply, fill #3
  Filled 2021-02-11: qty 60, 30d supply, fill #4
  Filled 2021-03-18: qty 60, 30d supply, fill #5
  Filled 2021-05-01: qty 60, 30d supply, fill #6
  Filled 2021-06-10: qty 60, 30d supply, fill #0
  Filled 2021-06-10: qty 60, 30d supply, fill #7
  Filled 2021-07-18: qty 60, 30d supply, fill #1
  Filled 2021-08-28: qty 60, 30d supply, fill #2

## 2020-09-26 MED ORDER — LISINOPRIL 2.5 MG PO TABS
2.5000 mg | ORAL_TABLET | Freq: Every day | ORAL | 1 refills | Status: DC
  Filled 2020-09-26: qty 30, 30d supply, fill #0
  Filled 2020-10-23: qty 30, 30d supply, fill #1

## 2020-09-26 NOTE — Progress Notes (Signed)
[facility]  Subjective:  Patient ID: Frank Landry, male    DOB: Nov 11, 1960  Age: 60 y.o. MRN: [id number]  CC: Diabetes   HPI Keishon Chavarin presents for follow-up of diabetes. Patient does not check blood sugar at home  Compliant with meds - Yes Checking CBGs? No  Fasting avg -   Postprandial average -  Exercising regularly? - Yes Watching carbohydrate intake? - Yes Neuropathy ? - No Hypoglycemic events - No  - Recovers with :   Pertinent ROS:  Polyuria - Yes Polydipsia - No Vision problems - Yes  Medications as noted below. Taking them regularly without complication/adverse reaction being reported today.   History Lyfe has a past medical history of Diabetes mellitus without complication ([place]) and No pertinent past medical history.   He has a past surgical history that includes No past surgeries and Hernia repair.   His family history includes Cancer in his father; Diabetes in his mother.He reports that he has quit smoking. His smoking use included cigarettes. He has a 9.50 pack-year smoking history. He has never used smokeless tobacco. He reports that he does not drink alcohol and does not use drugs.  Current Outpatient Medications on File Prior to Visit  Medication Sig Dispense Refill  . Colloidal Oatmeal (EUCERIN ECZEMA RELIEF) 1 % CREA Apply 156 g topically 2 (two) times daily as needed. 156 g 2  . glucose monitoring kit (FREESTYLE) monitoring kit 1 each by Does not apply route as needed for other. Please include lancet and test strip . Testing once daily (Patient not taking: Reported on 03/21/2020) 1 each 0   No current facility-administered medications on file prior to visit.    ROS Review of Systems  Genitourinary: Positive for frequency.  All other systems reviewed and are negative.   Objective:  BP 128/84 (BP Location: Right Arm, Patient Position: Sitting, Cuff Size: Large)   Pulse 77   Temp (!) 97.3 F (36.3  C) (Temporal)   Ht _0  (1.854 m)   Wt 206 lb 3.2 oz (93.5 kg)   SpO2 98%   BMI 27.20 kg/m   BP Readings from Last 3 Encounters:  09/26/20 128/84  03/21/20 127/85  03/11/20 115/85    Wt Readings from Last 3 Encounters:  09/26/20 206 lb 3.2 oz (93.5 kg)  03/21/20 192 lb 9.6 oz (87.4 kg)  03/11/20 195 lb (88.5 kg)    Physical Exam Vitals reviewed.  Constitutional:      Appearance: Normal appearance.  HENT:     Head: Normocephalic.     Right Ear: Tympanic membrane and external ear normal.     Left Ear: Tympanic membrane and external ear normal.     Nose: Nose normal.  Eyes:     Extraocular Movements: Extraocular movements intact.     Pupils: Pupils are equal, round, and reactive to light.  Cardiovascular:     Rate and Rhythm: Normal rate and regular rhythm.  Pulmonary:     Effort: Pulmonary effort is normal.     Breath sounds: Normal breath sounds.  Abdominal:     General: Abdomen is flat. Bowel sounds are normal.     Palpations: Abdomen is soft.  Musculoskeletal:        General: Normal range of motion.     Cervical back: Normal range of motion and neck supple.  Skin:    General: Skin is warm and dry.  Neurological:     Mental Status: He is alert and oriented to  person, place, and time.  Psychiatric:        Mood and Affect: Mood normal.        Behavior: Behavior normal.        Thought Content: Thought content normal.        Judgment: Judgment normal.     Lab Results  Component Value Date   HGBA1C 7.4 (A) 09/26/2020   HGBA1C 5.9 (A) 12/19/2019   HGBA1C 6.0 (A) 06/20/2019    Lab Results  Component Value Date   WBC 6.2 12/19/2019   HGB 14.3 12/19/2019   HCT 42.3 12/19/2019   PLT 223 12/19/2019   GLUCOSE 69 12/19/2019   CHOL 153 12/19/2019   TRIG 55 12/19/2019   HDL 59 12/19/2019   LDLCALC 83 12/19/2019   ALT 16 12/19/2019   AST 24 12/19/2019   NA 138 12/19/2019   K 5.0 12/19/2019   CL 103 12/19/2019   CREATININE 0.86 12/19/2019   BUN 11  12/19/2019   CO2 23 12/19/2019   TSH 2.310 04/13/2017   HGBA1C 7.4 (A) 09/26/2020     Assessment & Plan:   Lymon was seen today for diabetes.  Diagnoses and all orders for this visit:  Type 2 diabetes mellitus without complication, without long-term current use of insulin (HCC) A1C elevated should provider 5 bags of chips, debbie cakes and honey buns in his back pack that he takes to work and 2 apples to snack on at work.   Goal of therapy: Less than 6.5 hemoglobin A1c. Discussed to decrease foods that are high in carbohydrates are the following rice, potatoes, breads, sugars, and pastas.  Reduction in the intake (eating) will assist in lowering your blood sugars. D/C metformin 560m XR change metformin 1000 mg bid  -     HgB A1c 7.4 -     metFORMIN (GLUCOPHAGE) 1000 MG tablet; Take 1 tablet (1,000 mg total) by mouth 2 (two) times daily with a meal.  Medication refill -     hydrOXYzine (ATARAX/VISTARIL) 10 MG tablet; TAKE 1 TABLET (10 MG TOTAL) BY MOUTH EVERY 8 (EIGHT) HOURS AS NEEDED FOR ITCHING. -     atorvastatin (LIPITOR) 10 MG tablet; TAKE 1 TABLET (10 MG TOTAL) BY MOUTH DAILY. -     lisinopril (ZESTRIL) 2.5 MG tablet; Take 1 tablet (2.5 mg total) by mouth daily. -     sildenafil (VIAGRA) 100 MG tablet; TAKE 0.5-1 TABLETS (50-100 MG TOTAL) BY MOUTH DAILY AS NEEDED FOR ERECTILE DYSFUNCTION.  Hyperlipidemia, unspecified hyperlipidemia type  Decrease your fatty foods, red meat, cheese, milk and increase fiber like whole grains and veggies.  -     atorvastatin (LIPITOR) 10 MG tablet; TAKE 1 TABLET (10 MG TOTAL) BY MOUTH DAILY.  Moderate eczema With pruritus use an emollient lotion - Eucerin, Aveeno, and Aquaphor store name can be use     Chronic pain of left knee  Previously seeing by Dr.XU for osteoarthritis of left knee -     Ambulatory referral to Orthopedic Surgery  Erectile disorder, acquired, generalized, moderate -     sildenafil (VIAGRA) 100 MG tablet; TAKE 0.5-1  TABLETS (50-100 MG TOTAL) BY MOUTH DAILY AS NEEDED FOR ERECTILE DYSFUNCTION.  Constipation, unspecified constipation type add a fiber supplement like Metamucil or Benefiber.    I have discontinued CAyuub Penley [place]'s metFORMIN and sulfamethoxazole-trimethoprim. I am also having him start on metFORMIN and psyllium. Additionally, I am having him maintain his glucose monitoring kit, Eucerin Eczema Relief, hydrOXYzine, atorvastatin, lisinopril, and sildenafil.  Meds ordered this encounter  Medications  . hydrOXYzine (ATARAX/VISTARIL) 10 MG tablet    Sig: TAKE 1 TABLET (10 MG TOTAL) BY MOUTH EVERY 8 (EIGHT) HOURS AS NEEDED FOR ITCHING.    Dispense:  60 tablet    Refill:  1  . atorvastatin (LIPITOR) 10 MG tablet    Sig: TAKE 1 TABLET (10 MG TOTAL) BY MOUTH DAILY.    Dispense:  90 tablet    Refill:  3  . lisinopril (ZESTRIL) 2.5 MG tablet    Sig: Take 1 tablet (2.5 mg total) by mouth daily.    Dispense:  30 tablet    Refill:  1  . sildenafil (VIAGRA) 100 MG tablet    Sig: TAKE 0.5-1 TABLETS (50-100 MG TOTAL) BY MOUTH DAILY AS NEEDED FOR ERECTILE DYSFUNCTION.    Dispense:  5 tablet    Refill:  11  . metFORMIN (GLUCOPHAGE) 1000 MG tablet    Sig: Take 1 tablet (1,000 mg total) by mouth 2 (two) times daily with a meal.    Dispense:  180 tablet    Refill:  3  . psyllium (METAMUCIL SMOOTH TEXTURE) 28 % packet    Sig: Take 1 packet by mouth 2 (two) times daily.    Dispense:  60 packet    Refill:  6     Follow-up:   Return in about 3 months (around 12/26/2020) for fasting blood work DM .  The above assessment and management plan was discussed with the patient. The patient verbalized understanding of and has agreed to the management plan. Patient is aware to call the clinic if symptoms fail to improve or worsen. Patient is aware when to return to the clinic for a follow-up visit. Patient educated on when it is appropriate to go to the emergency department.   Juluis Mire, NP-C

## 2020-09-26 NOTE — Patient Instructions (Signed)
Diabetes Mellitus and Nutrition, Adult When you have diabetes, or diabetes mellitus, it is very important to have healthy eating habits because your blood sugar (glucose) levels are greatly affected by what you eat and drink. Eating healthy foods in the right amounts, at about the same times every day, can help you:  Control your blood glucose.  Lower your risk of heart disease.  Improve your blood pressure.  Reach or maintain a healthy weight. What can affect my meal plan? Every person with diabetes is different, and each person has different needs for a meal plan. Your health care provider may recommend that you work with a dietitian to make a meal plan that is best for you. Your meal plan may vary depending on factors such as:  The calories you need.  The medicines you take.  Your weight.  Your blood glucose, blood pressure, and cholesterol levels.  Your activity level.  Other health conditions you have, such as heart or kidney disease. How do carbohydrates affect me? Carbohydrates, also called carbs, affect your blood glucose level more than any other type of food. Eating carbs naturally raises the amount of glucose in your blood. Carb counting is a method for keeping track of how many carbs you eat. Counting carbs is important to keep your blood glucose at a healthy level, especially if you use insulin or take certain oral diabetes medicines. It is important to know how many carbs you can safely have in each meal. This is different for every person. Your dietitian can help you calculate how many carbs you should have at each meal and for each snack. How does alcohol affect me? Alcohol can cause a sudden decrease in blood glucose (hypoglycemia), especially if you use insulin or take certain oral diabetes medicines. Hypoglycemia can be a life-threatening condition. Symptoms of hypoglycemia, such as sleepiness, dizziness, and confusion, are similar to symptoms of having too much  alcohol.  Do not drink alcohol if: ? Your health care provider tells you not to drink. ? You are pregnant, may be pregnant, or are planning to become pregnant.  If you drink alcohol: ? Do not drink on an empty stomach. ? Limit how much you use to:  0-1 drink a day for women.  0-2 drinks a day for men. ? Be aware of how much alcohol is in your drink. In the [place] one drink equals one 12 oz bottle of beer (355 mL), one 5 oz glass of wine (148 mL), or one 1 oz glass of hard liquor (44 mL). ? Keep yourself hydrated with water, diet soda, or unsweetened iced tea.  Keep in mind that regular soda, juice, and other mixers may contain a lot of sugar and must be counted as carbs. What are tips for following this plan? Reading food labels  Start by checking the serving size on the "Nutrition Facts" label of packaged foods and drinks. The amount of calories, carbs, fats, and other nutrients listed on the label is based on one serving of the item. Many items contain more than one serving per package.  Check the total grams (g) of carbs in one serving. You can calculate the number of servings of carbs in one serving by dividing the total carbs by 15. For example, if a food has 30 g of total carbs per serving, it would be equal to 2 servings of carbs.  Check the number of grams (g) of saturated fats and trans fats in one serving. Choose foods that have   a low amount or none of these fats.  Check the number of milligrams (mg) of salt (sodium) in one serving. Most people should limit total sodium intake to less than 2,300 mg per day.  Always check the nutrition information of foods labeled as "low-fat" or "nonfat." These foods may be higher in added sugar or refined carbs and should be avoided.  Talk to your dietitian to identify your daily goals for nutrients listed on the label. Shopping  Avoid buying canned, pre-made, or processed foods. These foods tend to be high in fat, sodium, and added  sugar.  Shop around the outside edge of the grocery store. This is where you will most often find fresh fruits and vegetables, bulk grains, fresh meats, and fresh dairy. Cooking  Use low-heat cooking methods, such as baking, instead of high-heat cooking methods like deep frying.  Cook using healthy oils, such as olive, canola, or sunflower oil.  Avoid cooking with butter, cream, or high-fat meats. Meal planning  Eat meals and snacks regularly, preferably at the same times every day. Avoid going long periods of time without eating.  Eat foods that are high in fiber, such as fresh fruits, vegetables, beans, and whole grains. Talk with your dietitian about how many servings of carbs you can eat at each meal.  Eat 4-6 oz (112-168 g) of lean protein each day, such as lean meat, chicken, fish, eggs, or tofu. One ounce (oz) of lean protein is equal to: ? 1 oz (28 g) of meat, chicken, or fish. ? 1 egg. ?  cup (62 g) of tofu.  Eat some foods each day that contain healthy fats, such as avocado, nuts, seeds, and fish.   What foods should I eat? Fruits Berries. Apples. Oranges. Peaches. Apricots. Plums. Grapes. Mango. Papaya. Pomegranate. Kiwi. Cherries. Vegetables Lettuce. Spinach. Leafy greens, including kale, chard, collard greens, and mustard greens. Beets. Cauliflower. Cabbage. Broccoli. Carrots. Green beans. Tomatoes. Peppers. Onions. Cucumbers. Brussels sprouts. Grains Whole grains, such as whole-wheat or whole-grain bread, crackers, tortillas, cereal, and pasta. Unsweetened oatmeal. Quinoa. Brown or wild rice. Meats and other proteins Seafood. Poultry without skin. Lean cuts of poultry and beef. Tofu. Nuts. Seeds. Dairy Low-fat or fat-free dairy products such as milk, yogurt, and cheese. The items listed above may not be a complete list of foods and beverages you can eat. Contact a dietitian for more information. What foods should I avoid? Fruits Fruits canned with  syrup. Vegetables Canned vegetables. Frozen vegetables with butter or cream sauce. Grains Refined white flour and flour products such as bread, pasta, snack foods, and cereals. Avoid all processed foods. Meats and other proteins Fatty cuts of meat. Poultry with skin. Breaded or fried meats. Processed meat. Avoid saturated fats. Dairy Full-fat yogurt, cheese, or milk. Beverages Sweetened drinks, such as soda or iced tea. The items listed above may not be a complete list of foods and beverages you should avoid. Contact a dietitian for more information. Questions to ask a health care provider  Do I need to meet with a diabetes educator?  Do I need to meet with a dietitian?  What number can I call if I have questions?  When are the best times to check my blood glucose? Where to find more information:  American Diabetes Association: diabetes.org  Academy of Nutrition and Dietetics: www.eatright.org  National Institute of Diabetes and Digestive and Kidney Diseases: www.niddk.nih.gov  Association of Diabetes Care and Education Specialists: www.diabeteseducator.org Summary  It is important to have healthy eating   habits because your blood sugar (glucose) levels are greatly affected by what you eat and drink.  A healthy meal plan will help you control your blood glucose and maintain a healthy lifestyle.  Your health care provider may recommend that you work with a dietitian to make a meal plan that is best for you.  Keep in mind that carbohydrates (carbs) and alcohol have immediate effects on your blood glucose levels. It is important to count carbs and to use alcohol carefully. This information is not intended to replace advice given to you by your health care provider. Make sure you discuss any questions you have with your health care provider. Document Revised: 04/26/2019 Document Reviewed: 04/26/2019 Elsevier Patient Education  2021 Elsevier Inc.  

## 2020-09-28 ENCOUNTER — Other Ambulatory Visit: Payer: Self-pay

## 2020-10-03 ENCOUNTER — Other Ambulatory Visit: Payer: Self-pay

## 2020-10-03 ENCOUNTER — Encounter: Payer: Self-pay | Admitting: Orthopaedic Surgery

## 2020-10-03 ENCOUNTER — Ambulatory Visit (INDEPENDENT_AMBULATORY_CARE_PROVIDER_SITE_OTHER): Payer: PRIVATE HEALTH INSURANCE | Admitting: Orthopaedic Surgery

## 2020-10-03 ENCOUNTER — Ambulatory Visit (INDEPENDENT_AMBULATORY_CARE_PROVIDER_SITE_OTHER): Payer: PRIVATE HEALTH INSURANCE

## 2020-10-03 DIAGNOSIS — M1712 Unilateral primary osteoarthritis, left knee: Secondary | ICD-10-CM

## 2020-10-03 NOTE — Progress Notes (Signed)
Office Visit Note   Patient: Frank Landry           Date of Birth: [birth year]           MRN: [id number] Visit Date: 10/03/2020              Requested by: Grayce Sessions, NP [address] [place],  [place] [address] PCP: Grayce Sessions, NP   Assessment & Plan: Visit Diagnoses:  1. Primary osteoarthritis of left knee     Plan: Natnael has end-stage left knee DJD and conservative treatments have only provided temporary and partial relief.  At this time given his options he is interested in a more permanent solution such as a knee replacement.  Details of the surgery including rehab and recovery and anticipated time out of work reviewed today.  Risks including but not limited to infection, stiffness, incomplete relief of pain, DVT, loosening reviewed as well.  He denies nickel allergy or history of DVT.  We will plan to treat him in the operating room as soon as possible per his request.  Follow-Up Instructions: Return for Postop.   Orders:  Orders Placed This Encounter  Procedures  . XR Knee 1-2 Views Left   No orders of the defined types were placed in this encounter.     Procedures: No procedures performed   Clinical Data: No additional findings.   Subjective: Chief Complaint  Patient presents with  . Left Knee - Pain    Mr. Simenson is a very pleasant 60 year old gentleman here for follow-up of chronic and severe left knee pain due to advanced DJD.  I last saw him in August 2021 for the same problem and we injected his knee with cortisone which has given him good relief until recently.  He continues to use Voltaren gel on a daily basis which gives temporary relief.  He is having increasing difficulty with daily activities such as using steps and getting out of a chair.  Simple ambulation has become a lot more difficult especially with increased pain and crepitus.  He is having sensations of falling and giving way.  Denies any changes medically  otherwise.   Review of Systems  Constitutional: Negative.   All other systems reviewed and are negative.    Objective: Vital Signs: There were no vitals taken for this visit.  Physical Exam Vitals and nursing note reviewed.  Constitutional:      Appearance: He is well-developed.  Pulmonary:     Effort: Pulmonary effort is normal.  Abdominal:     Palpations: Abdomen is soft.  Skin:    General: Skin is warm.  Neurological:     Mental Status: He is alert and oriented to person, place, and time.  Psychiatric:        Behavior: Behavior normal.        Thought Content: Thought content normal.        Judgment: Judgment normal.     Ortho Exam Left knee shows range of motion of 0-90.  There is a severe pain and crepitus with range of motion.  Collaterals and cruciates are stable.  Mild joint line tenderness. Specialty Comments:  No specialty comments available.  Imaging: XR Knee 1-2 Views Left  Result Date: 10/03/2020 Advanced tricompartmental degenerative joint disease    PMFS History: Patient Active Problem List   Diagnosis Date Noted  . PVD (peripheral vascular disease) (HCC) 03/21/2020  . Hyponatremia 02/26/2012  . Impaired fasting glucose 02/26/2012  . Adverse drug reaction  to Zosyn (rash) 02/26/2012  . Cellulitis 02/25/2012   Past Medical History:  Diagnosis Date  . Diabetes mellitus without complication (HCC)   . No pertinent past medical history     Family History  Problem Relation Age of Onset  . Diabetes Mother   . Cancer Father     Past Surgical History:  Procedure Laterality Date  . HERNIA REPAIR    . NO PAST SURGERIES     Social History   Occupational History  . Not on file  Tobacco Use  . Smoking status: Former Smoker    Packs/day: 0.25    Years: 38.00    Pack years: 9.50    Types: Cigarettes  . Smokeless tobacco: Never Used  . Tobacco comment: quit 4 years ago   Substance and Sexual Activity  . Alcohol use: No  . Drug use: No     Types: Marijuana    Comment: every 2 weeks- Denies 10/08/15  . Sexual activity: Not on file

## 2020-10-17 ENCOUNTER — Telehealth (INDEPENDENT_AMBULATORY_CARE_PROVIDER_SITE_OTHER): Payer: Self-pay | Admitting: Primary Care

## 2020-10-17 NOTE — Telephone Encounter (Signed)
Please follow up

## 2020-10-17 NOTE — Telephone Encounter (Signed)
Pt called and is requesting to have 5 more pills of the sildenafil as per his normal prescription. Please advise.       [facility]  [address] [place] [address]  Phone: [phone] Fax: [phone]  Hours: M-F 8:30a-5:30p

## 2020-10-18 NOTE — Telephone Encounter (Signed)
Sent to PCP to send refill if appropriate.

## 2020-10-18 NOTE — Telephone Encounter (Signed)
Attempted to reach patient to inform that he has refills. He has a voicemail that has not been setup.

## 2020-10-19 ENCOUNTER — Telehealth: Payer: Self-pay | Admitting: Orthopaedic Surgery

## 2020-10-19 NOTE — Telephone Encounter (Signed)
Attempted to reach patient to notify him that he has refills. Vm not setup.

## 2020-10-19 NOTE — Telephone Encounter (Signed)
FYI

## 2020-10-19 NOTE — Telephone Encounter (Signed)
FYI:   Clearance request for left total knee arthroplasty with Dr. Roda Shutters was faxed to patient's PCP Gwinda Passe, NP on 10-03-20.  Form received today, however patient has not returned for labs CMP, LIPID, CBC, EKG and cannot be cleared at this time.  He will need to be reassessed.

## 2020-10-23 ENCOUNTER — Other Ambulatory Visit: Payer: Self-pay

## 2020-10-25 ENCOUNTER — Telehealth: Payer: Self-pay | Admitting: Orthopaedic Surgery

## 2020-10-25 NOTE — Telephone Encounter (Signed)
Patient left voice mail message today asking why his knee surgery has not been scheduled.  Call was returned, however unable to leave a message.  Patient has provided an alternate number belonging to a close friend [employer]).   Message left with Lucinda to have patient to contact my direct number  [phone]

## 2020-10-25 NOTE — Telephone Encounter (Signed)
Pt called and stated he wants to go through with the surgery that was discussed in his previous appt with Dr. Roda Shutters but was told by his primary that he should get his A1C down before considering surgery. He has an appt with his primary August 10th 2022 and wants to clarify the process of the surgery and will waiting for the A1C to lower make a difference. He works until [employer] everyday so he can only answer calls after 4pm. The best call back number is [phone].

## 2020-11-01 ENCOUNTER — Other Ambulatory Visit: Payer: Self-pay

## 2020-11-06 ENCOUNTER — Other Ambulatory Visit: Payer: Self-pay

## 2020-12-04 ENCOUNTER — Other Ambulatory Visit: Payer: Self-pay

## 2020-12-05 ENCOUNTER — Other Ambulatory Visit: Payer: Self-pay

## 2020-12-06 ENCOUNTER — Other Ambulatory Visit: Payer: Self-pay

## 2020-12-06 ENCOUNTER — Other Ambulatory Visit (INDEPENDENT_AMBULATORY_CARE_PROVIDER_SITE_OTHER): Payer: Self-pay | Admitting: Primary Care

## 2020-12-06 DIAGNOSIS — Z760 Encounter for issue of repeat prescription: Secondary | ICD-10-CM

## 2020-12-06 MED ORDER — LISINOPRIL 2.5 MG PO TABS
2.5000 mg | ORAL_TABLET | Freq: Every day | ORAL | 1 refills | Status: DC
  Filled 2020-12-06: qty 30, 30d supply, fill #0
  Filled 2021-01-09: qty 30, 30d supply, fill #1

## 2021-01-09 ENCOUNTER — Encounter (INDEPENDENT_AMBULATORY_CARE_PROVIDER_SITE_OTHER): Payer: Self-pay | Admitting: Nurse Practitioner

## 2021-01-09 ENCOUNTER — Other Ambulatory Visit (INDEPENDENT_AMBULATORY_CARE_PROVIDER_SITE_OTHER): Payer: Self-pay | Admitting: Primary Care

## 2021-01-09 ENCOUNTER — Other Ambulatory Visit: Payer: Self-pay

## 2021-01-09 ENCOUNTER — Ambulatory Visit (INDEPENDENT_AMBULATORY_CARE_PROVIDER_SITE_OTHER): Payer: PRIVATE HEALTH INSURANCE | Admitting: Nurse Practitioner

## 2021-01-09 VITALS — BP 126/90 | HR 77 | Temp 97.5°F | Ht 73.0 in | Wt 200.2 lb

## 2021-01-09 DIAGNOSIS — E119 Type 2 diabetes mellitus without complications: Secondary | ICD-10-CM | POA: Diagnosis not present

## 2021-01-09 DIAGNOSIS — Z760 Encounter for issue of repeat prescription: Secondary | ICD-10-CM

## 2021-01-09 LAB — POCT GLYCOSYLATED HEMOGLOBIN (HGB A1C): Hemoglobin A1C: 6.4 % — AB (ref 4.0–5.6)

## 2021-01-09 NOTE — Patient Instructions (Signed)
Diabetes:  HGB A1C = 6.4  improved since last visit  Avoid unhealthy snacks  Stay active  Diabetic diet  Continue: metformin (GLUCOPHAGE) 1000 MG tablet; Take 1 tablet (1,000 mg total) by mouth 2 (two) times daily with a meal.  Diabetes Mellitus and Nutrition, Adult When you have diabetes, or diabetes mellitus, it is very important to have healthy eating habits because your blood sugar (glucose) levels are greatly affected by what you eat and drink. Eating healthy foods in the right amounts, at about the same times every day, can help you: Control your blood glucose. Lower your risk of heart disease. Improve your blood pressure. Reach or maintain a healthy weight. What can affect my meal plan? Every person with diabetes is different, and each person has different needs for a meal plan. Your health care provider may recommend that you work with a dietitian to make a meal plan that is best for you. Your meal plan may vary depending on factors such as: The calories you need. The medicines you take. Your weight. Your blood glucose, blood pressure, and cholesterol levels. Your activity level. Other health conditions you have, such as heart or kidney disease. How do carbohydrates affect me? Carbohydrates, also called carbs, affect your blood glucose level more than any other type of food. Eating carbs naturally raises the amount of glucose in your blood. Carb counting is a method for keeping track of how many carbs you eat. Counting carbs is important to keep your blood glucose at a healthy level,especially if you use insulin or take certain oral diabetes medicines. It is important to know how many carbs you can safely have in each meal. This is different for every person. Your dietitian can help you calculate how manycarbs you should have at each meal and for each snack. How does alcohol affect me? Alcohol can cause a sudden decrease in blood glucose (hypoglycemia), especially if you use  insulin or take certain oral diabetes medicines. Hypoglycemia can be a life-threatening condition. Symptoms of hypoglycemia, such as sleepiness, dizziness, and confusion, are similar to symptoms of having too much alcohol. Do not drink alcohol if: Your health care provider tells you not to drink. You are pregnant, may be pregnant, or are planning to become pregnant. If you drink alcohol: Do not drink on an empty stomach. Limit how much you use to: 0-1 drink a day for women. 0-2 drinks a day for men. Be aware of how much alcohol is in your drink. In the [place] one drink equals one 12 oz bottle of beer (355 mL), one 5 oz glass of wine (148 mL), or one 1 oz glass of hard liquor (44 mL). Keep yourself hydrated with water, diet soda, or unsweetened iced tea. Keep in mind that regular soda, juice, and other mixers may contain a lot of sugar and must be counted as carbs. What are tips for following this plan?  Reading food labels Start by checking the serving size on the "Nutrition Facts" label of packaged foods and drinks. The amount of calories, carbs, fats, and other nutrients listed on the label is based on one serving of the item. Many items contain more than one serving per package. Check the total grams (g) of carbs in one serving. You can calculate the number of servings of carbs in one serving by dividing the total carbs by 15. For example, if a food has 30 g of total carbs per serving, it would be equal to 2 servings of carbs.  Check the number of grams (g) of saturated fats and trans fats in one serving. Choose foods that have a low amount or none of these fats. Check the number of milligrams (mg) of salt (sodium) in one serving. Most people should limit total sodium intake to less than 2,300 mg per day. Always check the nutrition information of foods labeled as "low-fat" or "nonfat." These foods may be higher in added sugar or refined carbs and should be avoided. Talk to your dietitian to  identify your daily goals for nutrients listed on the label. Shopping Avoid buying canned, pre-made, or processed foods. These foods tend to be high in fat, sodium, and added sugar. Shop around the outside edge of the grocery store. This is where you will most often find fresh fruits and vegetables, bulk grains, fresh meats, and fresh dairy. Cooking Use low-heat cooking methods, such as baking, instead of high-heat cooking methods like deep frying. Cook using healthy oils, such as olive, canola, or sunflower oil. Avoid cooking with butter, cream, or high-fat meats. Meal planning Eat meals and snacks regularly, preferably at the same times every day. Avoid going long periods of time without eating. Eat foods that are high in fiber, such as fresh fruits, vegetables, beans, and whole grains. Talk with your dietitian about how many servings of carbs you can eat at each meal. Eat 4-6 oz (112-168 g) of lean protein each day, such as lean meat, chicken, fish, eggs, or tofu. One ounce (oz) of lean protein is equal to: 1 oz (28 g) of meat, chicken, or fish. 1 egg.  cup (62 g) of tofu. Eat some foods each day that contain healthy fats, such as avocado, nuts, seeds, and fish. What foods should I eat? Fruits Berries. Apples. Oranges. Peaches. Apricots. Plums. Grapes. Mango. Papaya.Pomegranate. Kiwi. Cherries. Vegetables Lettuce. Spinach. Leafy greens, including kale, chard, collard greens, and mustard greens. Beets. Cauliflower. Cabbage. Broccoli. Carrots. Green beans.Tomatoes. Peppers. Onions. Cucumbers. Brussels sprouts. Grains Whole grains, such as whole-wheat or whole-grain bread, crackers, tortillas,cereal, and pasta. Unsweetened oatmeal. Quinoa. Brown or wild rice. Meats and other proteins Seafood. Poultry without skin. Lean cuts of poultry and beef. Tofu. Nuts. Seeds. Dairy Low-fat or fat-free dairy products such as milk, yogurt, and cheese. The items listed above may not be a complete list of  foods and beverages you can eat. Contact a dietitian for more information. What foods should I avoid? Fruits Fruits canned with syrup. Vegetables Canned vegetables. Frozen vegetables with butter or cream sauce. Grains Refined white flour and flour products such as bread, pasta, snack foods, andcereals. Avoid all processed foods. Meats and other proteins Fatty cuts of meat. Poultry with skin. Breaded or fried meats. Processed meat.Avoid saturated fats. Dairy Full-fat yogurt, cheese, or milk. Beverages Sweetened drinks, such as soda or iced tea. The items listed above may not be a complete list of foods and beverages you should avoid. Contact a dietitian for more information. Questions to ask a health care provider Do I need to meet with a diabetes educator? Do I need to meet with a dietitian? What number can I call if I have questions? When are the best times to check my blood glucose? Where to find more information: American Diabetes Association: diabetes.org Academy of Nutrition and Dietetics: www.eatright.[employer] of Diabetes and Digestive and Kidney Diseases: [employer] Association of Diabetes Care and Education Specialists: www.diabeteseducator.org Summary It is important to have healthy eating habits because your blood sugar (glucose) levels are greatly affected by what  you eat and drink. A healthy meal plan will help you control your blood glucose and maintain a healthy lifestyle. Your health care provider may recommend that you work with a dietitian to make a meal plan that is best for you. Keep in mind that carbohydrates (carbs) and alcohol have immediate effects on your blood glucose levels. It is important to count carbs and to use alcohol carefully. This information is not intended to replace advice given to you by your health care provider. Make sure you discuss any questions you have with your healthcare provider. Document Revised: 04/26/2019 Document  Reviewed: 04/26/2019 Elsevier Patient Education  2021 [employer].

## 2021-01-09 NOTE — Progress Notes (Signed)
Subjective:     Frank Landry is a 60 y.o. male who presents for follow up of diabetes.. Current symptoms include: none. Patient denies foot ulcerations, hyperglycemia, increased appetite, nausea, polydipsia, polyuria, and vomiting. Evaluation to date has been: fasting blood sugar, hemoglobin A1C, and not checking . Home sugars: patient does not check sugars. Current treatments: more intensive attention to diet which has been effective. Last dilated eye exam needs to schedule.    Review of Systems Review of Systems  Constitutional: Negative.   HENT: Negative.    Eyes: Negative.   Respiratory: Negative.    Cardiovascular: Negative.   Skin: Negative.   Neurological: Negative.   Endo/Heme/Allergies: Negative.   Psychiatric/Behavioral: Negative.       Objective:      Physical Exam Constitutional:      General: He is not in acute distress. Cardiovascular:     Rate and Rhythm: Normal rate and regular rhythm.  Pulmonary:     Effort: Pulmonary effort is normal.     Breath sounds: Normal breath sounds.  Musculoskeletal:     Right lower leg: No edema.     Left lower leg: No edema.  Skin:    General: Skin is warm and dry.  Neurological:     Mental Status: He is alert and oriented to person, place, and time.  Psychiatric:        Mood and Affect: Affect normal.     Laboratory: No components found for: A1C    Assessment:    Diabetes mellitus Type II, under good control.    Plan:    Educational material distributed. Addressed ADA diet. Suggested low cholesterol diet. Encouraged aerobic exercise. Reminded to get yearly retinal exam. Nutritionist referral. Follow up in 3 months or as needed.    Patient Instructions  Diabetes:  HGB A1C = 6.4  improved since last visit  Avoid unhealthy snacks  Stay active  Diabetic diet  Continue: metformin (GLUCOPHAGE) 1000 MG tablet; Take 1 tablet (1,000 mg total) by mouth 2 (two) times daily with a meal.  Diabetes  Mellitus and Nutrition, Adult When you have diabetes, or diabetes mellitus, it is very important to have healthy eating habits because your blood sugar (glucose) levels are greatly affected by what you eat and drink. Eating healthy foods in the right amounts, at about the same times every day, can help you: Control your blood glucose. Lower your risk of heart disease. Improve your blood pressure. Reach or maintain a healthy weight. What can affect my meal plan? Every person with diabetes is different, and each person has different needs for a meal plan. Your health care provider may recommend that you work with a dietitian to make a meal plan that is best for you. Your meal plan may vary depending on factors such as: The calories you need. The medicines you take. Your weight. Your blood glucose, blood pressure, and cholesterol levels. Your activity level. Other health conditions you have, such as heart or kidney disease. How do carbohydrates affect me? Carbohydrates, also called carbs, affect your blood glucose level more than any other type of food. Eating carbs naturally raises the amount of glucose in your blood. Carb counting is a method for keeping track of how many carbs you eat. Counting carbs is important to keep your blood glucose at a healthy level,especially if you use insulin or take certain oral diabetes medicines. It is important to know how many carbs you can safely have in each meal. This is  different for every person. Your dietitian can help you calculate how manycarbs you should have at each meal and for each snack. How does alcohol affect me? Alcohol can cause a sudden decrease in blood glucose (hypoglycemia), especially if you use insulin or take certain oral diabetes medicines. Hypoglycemia can be a life-threatening condition. Symptoms of hypoglycemia, such as sleepiness, dizziness, and confusion, are similar to symptoms of having too much alcohol. Do not drink alcohol  if: Your health care provider tells you not to drink. You are pregnant, may be pregnant, or are planning to become pregnant. If you drink alcohol: Do not drink on an empty stomach. Limit how much you use to: 0-1 drink a day for women. 0-2 drinks a day for men. Be aware of how much alcohol is in your drink. In the [place] one drink equals one 12 oz bottle of beer (355 mL), one 5 oz glass of wine (148 mL), or one 1 oz glass of hard liquor (44 mL). Keep yourself hydrated with water, diet soda, or unsweetened iced tea. Keep in mind that regular soda, juice, and other mixers may contain a lot of sugar and must be counted as carbs. What are tips for following this plan?  Reading food labels Start by checking the serving size on the "Nutrition Facts" label of packaged foods and drinks. The amount of calories, carbs, fats, and other nutrients listed on the label is based on one serving of the item. Many items contain more than one serving per package. Check the total grams (g) of carbs in one serving. You can calculate the number of servings of carbs in one serving by dividing the total carbs by 15. For example, if a food has 30 g of total carbs per serving, it would be equal to 2 servings of carbs. Check the number of grams (g) of saturated fats and trans fats in one serving. Choose foods that have a low amount or none of these fats. Check the number of milligrams (mg) of salt (sodium) in one serving. Most people should limit total sodium intake to less than 2,300 mg per day. Always check the nutrition information of foods labeled as "low-fat" or "nonfat." These foods may be higher in added sugar or refined carbs and should be avoided. Talk to your dietitian to identify your daily goals for nutrients listed on the label. Shopping Avoid buying canned, pre-made, or processed foods. These foods tend to be high in fat, sodium, and added sugar. Shop around the outside edge of the grocery store. This is  where you will most often find fresh fruits and vegetables, bulk grains, fresh meats, and fresh dairy. Cooking Use low-heat cooking methods, such as baking, instead of high-heat cooking methods like deep frying. Cook using healthy oils, such as olive, canola, or sunflower oil. Avoid cooking with butter, cream, or high-fat meats. Meal planning Eat meals and snacks regularly, preferably at the same times every day. Avoid going long periods of time without eating. Eat foods that are high in fiber, such as fresh fruits, vegetables, beans, and whole grains. Talk with your dietitian about how many servings of carbs you can eat at each meal. Eat 4-6 oz (112-168 g) of lean protein each day, such as lean meat, chicken, fish, eggs, or tofu. One ounce (oz) of lean protein is equal to: 1 oz (28 g) of meat, chicken, or fish. 1 egg.  cup (62 g) of tofu. Eat some foods each day that contain healthy fats, such as  avocado, nuts, seeds, and fish. What foods should I eat? Fruits Berries. Apples. Oranges. Peaches. Apricots. Plums. Grapes. Mango. Papaya.Pomegranate. Kiwi. Cherries. Vegetables Lettuce. Spinach. Leafy greens, including kale, chard, collard greens, and mustard greens. Beets. Cauliflower. Cabbage. Broccoli. Carrots. Green beans.Tomatoes. Peppers. Onions. Cucumbers. Brussels sprouts. Grains Whole grains, such as whole-wheat or whole-grain bread, crackers, tortillas,cereal, and pasta. Unsweetened oatmeal. Quinoa. Brown or wild rice. Meats and other proteins Seafood. Poultry without skin. Lean cuts of poultry and beef. Tofu. Nuts. Seeds. Dairy Low-fat or fat-free dairy products such as milk, yogurt, and cheese. The items listed above may not be a complete list of foods and beverages you can eat. Contact a dietitian for more information. What foods should I avoid? Fruits Fruits canned with syrup. Vegetables Canned vegetables. Frozen vegetables with butter or cream sauce. Grains Refined white  flour and flour products such as bread, pasta, snack foods, andcereals. Avoid all processed foods. Meats and other proteins Fatty cuts of meat. Poultry with skin. Breaded or fried meats. Processed meat.Avoid saturated fats. Dairy Full-fat yogurt, cheese, or milk. Beverages Sweetened drinks, such as soda or iced tea. The items listed above may not be a complete list of foods and beverages you should avoid. Contact a dietitian for more information. Questions to ask a health care provider Do I need to meet with a diabetes educator? Do I need to meet with a dietitian? What number can I call if I have questions? When are the best times to check my blood glucose? Where to find more information: American Diabetes Association: diabetes.org Academy of Nutrition and Dietetics: www.eatright.[employer] of Diabetes and Digestive and Kidney Diseases: [employer] Association of Diabetes Care and Education Specialists: www.diabeteseducator.org Summary It is important to have healthy eating habits because your blood sugar (glucose) levels are greatly affected by what you eat and drink. A healthy meal plan will help you control your blood glucose and maintain a healthy lifestyle. Your health care provider may recommend that you work with a dietitian to make a meal plan that is best for you. Keep in mind that carbohydrates (carbs) and alcohol have immediate effects on your blood glucose levels. It is important to count carbs and to use alcohol carefully. This information is not intended to replace advice given to you by your health care provider. Make sure you discuss any questions you have with your healthcare provider. Document Revised: 04/26/2019 Document Reviewed: 04/26/2019 Elsevier Patient Education  2021 [employer].

## 2021-01-09 NOTE — Telephone Encounter (Signed)
  Notes to clinic:  Patient was seen today for appt  Review for continued use    Requested Prescriptions  Pending Prescriptions Disp Refills   hydrOXYzine (ATARAX/VISTARIL) 10 MG tablet 60 tablet 1    Sig: TAKE 1 TABLET (10 MG TOTAL) BY MOUTH EVERY 8 (EIGHT) HOURS AS NEEDED FOR ITCHING.      Ear, Nose, and Throat:  Antihistamines Passed - 01/09/2021 11:12 AM      Passed - Valid encounter within last 12 months    Recent Outpatient Visits           Today Type 2 diabetes mellitus without complication, without long-term current use of insulin (HCC)   [facility] Ivonne Andrew, NP   3 months ago Type 2 diabetes mellitus without complication, without long-term current use of insulin (HCC)   [facility] Grayce Sessions, NP   9 months ago Need for immunization against influenza   [facility] Grayce Sessions, NP   1 year ago Type 2 diabetes mellitus without complication, without long-term current use of insulin (HCC)   [facility] Grayce Sessions, NP   1 year ago Type 2 diabetes mellitus without complication, without long-term current use of insulin (HCC)   [facility] Grayce Sessions, NP       Future Appointments             In 3 months Randa Evens, Kinnie Scales, NP [facility]

## 2021-01-12 MED ORDER — HYDROXYZINE HCL 10 MG PO TABS
ORAL_TABLET | ORAL | 1 refills | Status: DC
  Filled 2021-01-12 – 2021-06-12 (×3): qty 60, 20d supply, fill #0

## 2021-01-14 ENCOUNTER — Other Ambulatory Visit: Payer: Self-pay

## 2021-01-21 ENCOUNTER — Other Ambulatory Visit: Payer: Self-pay

## 2021-02-11 ENCOUNTER — Other Ambulatory Visit: Payer: Self-pay

## 2021-02-12 ENCOUNTER — Other Ambulatory Visit: Payer: Self-pay

## 2021-03-18 ENCOUNTER — Other Ambulatory Visit: Payer: Self-pay

## 2021-04-04 ENCOUNTER — Other Ambulatory Visit (INDEPENDENT_AMBULATORY_CARE_PROVIDER_SITE_OTHER): Payer: Self-pay | Admitting: Primary Care

## 2021-04-04 ENCOUNTER — Other Ambulatory Visit: Payer: Self-pay

## 2021-04-04 DIAGNOSIS — Z760 Encounter for issue of repeat prescription: Secondary | ICD-10-CM

## 2021-04-04 NOTE — Telephone Encounter (Signed)
Sent to PCP to refill if appropriate.  

## 2021-04-06 MED ORDER — LISINOPRIL 2.5 MG PO TABS
2.5000 mg | ORAL_TABLET | Freq: Every day | ORAL | 1 refills | Status: DC
  Filled 2021-04-06: qty 30, 30d supply, fill #0
  Filled 2021-05-16: qty 30, 30d supply, fill #1

## 2021-04-08 ENCOUNTER — Other Ambulatory Visit: Payer: Self-pay

## 2021-04-10 ENCOUNTER — Ambulatory Visit (INDEPENDENT_AMBULATORY_CARE_PROVIDER_SITE_OTHER): Payer: PRIVATE HEALTH INSURANCE | Admitting: Primary Care

## 2021-04-10 ENCOUNTER — Other Ambulatory Visit: Payer: Self-pay

## 2021-04-10 DIAGNOSIS — F5221 Male erectile disorder: Secondary | ICD-10-CM | POA: Diagnosis not present

## 2021-04-10 DIAGNOSIS — E119 Type 2 diabetes mellitus without complications: Secondary | ICD-10-CM | POA: Diagnosis not present

## 2021-04-10 LAB — POCT GLYCOSYLATED HEMOGLOBIN (HGB A1C): Hemoglobin A1C: 6.2 % — AB (ref 4.0–5.6)

## 2021-04-10 NOTE — Progress Notes (Signed)
I connected with  Frank Landry on 04/10/21 by a video enabled telemedicine application and verified that I am speaking with the correct person using two identifiers.   I discussed the limitations of evaluation and management by telemedicine. The patient expressed understanding and agreed to proceed.  Patient needs refill of viagra

## 2021-04-14 NOTE — Progress Notes (Signed)
Frank Landry  Virtual Visit via Telephone Note  I connected with Frank Landry, on 04/14/2021 at 11:24 PM telephone and verified that I am speaking with the correct person using two identifiers.   Consent: I discussed the limitations, risks, security and privacy concerns of performing an evaluation and management service by telephone and the availability of in person appointments. I also discussed with the patient that there may be a patient responsible charge related to this service. The patient expressed understanding and agreed to proceed.   Location of Patient: home  Location of Provider: [facility] [place]   Persons participating in Telemedicine visit: Frank Macqueen Juluis Mire,  NP Frank Landry , CMA  History of Present Illness: Frank Landry is a obese Male, 60 y.o.f/u on diabetes and medication refill. Pertinent ROS:  Polyuria - No Polydipsia - No Vision problems - No  Past Medical History:  Diagnosis Date   Diabetes mellitus without complication (HCC)    No pertinent past medical history    Allergies  Allergen Reactions   Zosyn [Piperacillin Sod-Tazobactam So] Rash    Current Outpatient Medications on File Prior to Visit  Medication Sig Dispense Refill   atorvastatin (LIPITOR) 10 MG tablet TAKE 1 TABLET (10 MG TOTAL) BY MOUTH DAILY. 90 tablet 3   Colloidal Oatmeal (EUCERIN ECZEMA RELIEF) 1 % CREA Apply 156 g topically 2 (two) times daily as needed. 156 g 2   glucose monitoring kit (FREESTYLE) monitoring kit 1 each by Does not apply route as needed for other. Please include lancet and test strip . Testing once daily 1 each 0   hydrOXYzine (ATARAX/VISTARIL) 10 MG tablet TAKE 1 TABLET (10 MG TOTAL) BY MOUTH EVERY 8 (EIGHT) HOURS AS NEEDED FOR ITCHING. 60 tablet 1   lisinopril (ZESTRIL) 2.5 MG tablet Take 1 tablet (2.5 mg total) by mouth daily. 30 tablet 1    metFORMIN (GLUCOPHAGE) 1000 MG tablet Take 1 tablet (1,000 mg total) by mouth 2 (two) times daily with a meal. 180 tablet 3   psyllium (METAMUCIL SMOOTH TEXTURE) 28 % packet Take 1 packet by mouth 2 (two) times daily. 60 packet 6   sildenafil (VIAGRA) 100 MG tablet TAKE 0.5-1 TABLETS (50-100 MG TOTAL) BY MOUTH DAILY AS NEEDED FOR ERECTILE DYSFUNCTION. 5 tablet 11   No current facility-administered medications on file prior to visit.    Observations/Objective: Home  Assessment and Plan: Frank Landry was seen today for diabetes.  Diagnoses and all orders for this visit:  Type 2 diabetes mellitus without complication, without long-term current use of insulin (HCC) -     HgB A1c 6.2 per ADA guidelines prediabetes 5.7-6.4 . Monitor foods that are high in carbohydrates are the following rice, potatoes, breads, sugars, and pastas.  Reduction in the intake (eating) will assist in lowering your blood sugars.  Continue metformin 1032m bid   Erectile disorder, acquired, generalized, moderate Has re refills on Viagra     Follow Up Instructions: Keep schedule DM appt 6 months    I discussed the assessment and treatment plan with the patient. The patient was provided an opportunity to ask questions and all were answered. The patient agreed with the plan and demonstrated an understanding of the instructions.   The patient was advised to call back or seek an in-person evaluation if the symptoms worsen or if the condition fails to improve as anticipated.     I provided 12 minutes total of non-face-to-face time  during this encounter including median intraservice time, reviewing previous notes, investigations, ordering medications, medical decision making, coordinating care and patient verbalized understanding at the end of the visit.    This note has been created with Surveyor, quantity. Any transcriptional errors are unintentional.   Kerin Perna, NP 04/14/2021, 11:24 PM

## 2021-05-01 ENCOUNTER — Other Ambulatory Visit: Payer: Self-pay

## 2021-05-02 ENCOUNTER — Other Ambulatory Visit: Payer: Self-pay

## 2021-05-16 ENCOUNTER — Other Ambulatory Visit: Payer: Self-pay

## 2021-05-20 ENCOUNTER — Other Ambulatory Visit: Payer: Self-pay

## 2021-06-11 ENCOUNTER — Other Ambulatory Visit: Payer: Self-pay

## 2021-06-12 ENCOUNTER — Other Ambulatory Visit: Payer: Self-pay

## 2021-06-14 ENCOUNTER — Other Ambulatory Visit: Payer: Self-pay

## 2021-07-10 ENCOUNTER — Ambulatory Visit (INDEPENDENT_AMBULATORY_CARE_PROVIDER_SITE_OTHER): Payer: PRIVATE HEALTH INSURANCE | Admitting: Primary Care

## 2021-07-11 ENCOUNTER — Ambulatory Visit (INDEPENDENT_AMBULATORY_CARE_PROVIDER_SITE_OTHER): Payer: PRIVATE HEALTH INSURANCE | Admitting: Primary Care

## 2021-07-15 ENCOUNTER — Other Ambulatory Visit (INDEPENDENT_AMBULATORY_CARE_PROVIDER_SITE_OTHER): Payer: Self-pay | Admitting: Primary Care

## 2021-07-15 DIAGNOSIS — Z760 Encounter for issue of repeat prescription: Secondary | ICD-10-CM

## 2021-07-15 DIAGNOSIS — F5221 Male erectile disorder: Secondary | ICD-10-CM

## 2021-07-15 DIAGNOSIS — E119 Type 2 diabetes mellitus without complications: Secondary | ICD-10-CM

## 2021-07-15 NOTE — Telephone Encounter (Addendum)
Medication Refill - Medication: metFORMIN (GLUCOPHAGE) 1000 MG tablet,sildenafil (VIAGRA) 100 MG tablet   Has the patient contacted their pharmacy? No. Pt stated he always calls PCP.   (Agent: If no, request that the patient contact the pharmacy for the refill. If patient does not wish to contact the pharmacy document the reason why and proceed with request.)   Preferred Pharmacy (with phone number or street name):  [facility] [place]. [address] [place] [address]  Phone: [phone] Fax: [phone]  Hours: M-F 7:30a-6:00p   Has the patient been seen for an appointment in the last year OR does the patient have an upcoming appointment? Yes.    Agent: Please be advised that RX refills may take up to 3 business days. We ask that you follow-up with your pharmacy.

## 2021-07-16 NOTE — Telephone Encounter (Signed)
Requested medication (s) are due for refill today: Yes  Requested medication (s) are on the active medication list: Yes  Last refill:  09/26/20  Future visit scheduled: Yes  Notes to clinic:  Protocols indicate pt. Needs lab work.    Requested Prescriptions  Pending Prescriptions Disp Refills   metFORMIN (GLUCOPHAGE) 1000 MG tablet 180 tablet 3    Sig: Take 1 tablet (1,000 mg total) by mouth 2 (two) times daily with a meal.     Endocrinology:  Diabetes - Biguanides Failed - 07/15/2021  2:34 PM      Failed - Cr in normal range and within 360 days    Creatinine, Ser  Date Value Ref Range Status  12/19/2019 0.86 0.76 - 1.27 mg/dL Final          Failed - eGFR in normal range and within 360 days    GFR calc Af Amer  Date Value Ref Range Status  12/19/2019 108 >59 mL/min/1.73 Final    Comment:    **[facility] currently reports eGFR in compliance with the current**   recommendations of the [employer]. [facility] will   update reporting as new guidelines are published from the NKF-ASN   Task force.    GFR calc non Af Amer  Date Value Ref Range Status  12/19/2019 94 >59 mL/min/1.73 Final          Failed - B12 Level in normal range and within 720 days    No results found for: VITAMINB12        Failed - Valid encounter within last 6 months    Recent Outpatient Visits           3 months ago Type 2 diabetes mellitus without complication, without long-term current use of insulin ([place])   [place] [facility] Juluis Mire P, NP   6 months ago Type 2 diabetes mellitus without complication, without long-term current use of insulin ([place])   [place] [facility] Foy, NP   9 months ago Type 2 diabetes mellitus without complication, without long-term current use of insulin ([place])   [place] [facility] Kerin Perna, NP   1 year ago Need for immunization against influenza   [place]  Kerin Perna, NP   1 year ago Type 2 diabetes mellitus without complication, without long-term current use of insulin ([place])   [place] [facility] Kerin Perna, NP       Future Appointments             In 2 months Edwards, Milford Cage, NP [facility]            Failed - CBC within normal limits and completed in the last 12 months    WBC  Date Value Ref Range Status  12/19/2019 6.2 3.4 - 10.8 x10E3/uL Final  07/26/2018 4.7 4.0 - 10.5 K/uL Final   RBC  Date Value Ref Range Status  12/19/2019 4.41 4.14 - 5.80 x10E6/uL Final  07/26/2018 4.70 4.22 - 5.81 MIL/uL Final   Hemoglobin  Date Value Ref Range Status  12/19/2019 14.3 13.0 - 17.7 g/dL Final   Hematocrit  Date Value Ref Range Status  12/19/2019 42.3 37.5 - 51.0 % Final   MCHC  Date Value Ref Range Status  12/19/2019 33.8 31.5 - 35.7 g/dL Final  07/26/2018 32.9 30.0 - 36.0 g/dL Final   [facility]  Date Value Ref Range Status  12/19/2019 32.4 26.6 - 33.0 pg Final  07/26/2018 30.4 26.0 - 34.0 pg Final   MCV  Date Value Ref Range Status  12/19/2019 96 79 - 97 fL Final   No results found for: PLTCOUNTKUC, LABPLAT, POCPLA RDW  Date Value Ref Range Status  12/19/2019 12.9 11.6 - 15.4 % Final         Passed - HBA1C is between 0 and 7.9 and within 180 days    Hemoglobin A1C  Date Value Ref Range Status  04/10/2021 6.2 (A) 4.0 - 5.6 % Final   Hgb A1c MFr Bld  Date Value Ref Range Status  02/27/2012 6.4 (H) <5.7 % Final    Comment:    (NOTE)                                                                       According to the ADA Clinical Practice Recommendations for 2011, when HbA1c is used as a screening test:  >=6.5%   Diagnostic of Diabetes Mellitus           (if abnormal result is confirmed) 5.7-6.4%   Increased risk of developing Diabetes Mellitus References:Diagnosis and Classification of Diabetes Mellitus,Diabetes Care,2011,34(Suppl 1):S62-S69 and  Standards of Medical Care in         Diabetes - 2011,Diabetes HUTM,5465,03 (Suppl 1):S11-S61.           sildenafil (VIAGRA) 100 MG tablet 5 tablet 11    Sig: TAKE 0.5-1 TABLETS (50-100 MG TOTAL) BY MOUTH DAILY AS NEEDED FOR ERECTILE DYSFUNCTION.     Urology: Erectile Dysfunction Agents Failed - 07/15/2021  2:34 PM      Failed - AST in normal range and within 360 days    AST  Date Value Ref Range Status  12/19/2019 24 0 - 40 IU/L Final          Failed - ALT in normal range and within 360 days    ALT  Date Value Ref Range Status  12/19/2019 16 0 - 44 IU/L Final          Failed - Last BP in normal range    BP Readings from Last 1 Encounters:  01/09/21 126/90          Passed - Valid encounter within last 12 months    Recent Outpatient Visits           3 months ago Type 2 diabetes mellitus without complication, without long-term current use of insulin ([place])   [place] [facility] Juluis Mire P, NP   6 months ago Type 2 diabetes mellitus without complication, without long-term current use of insulin ([place])   [place] Fenton Foy, NP   9 months ago Type 2 diabetes mellitus without complication, without long-term current use of insulin ([place])   [place] [facility] Kerin Perna, NP   1 year ago Need for immunization against influenza   [place] Kerin Perna, NP   1 year ago Type 2 diabetes mellitus without complication, without long-term current use of insulin ([place])   [facility] Kerin Perna, NP       Future Appointments  In 2 months Kerin Perna, NP [place]

## 2021-07-18 ENCOUNTER — Other Ambulatory Visit: Payer: Self-pay

## 2021-07-18 NOTE — Telephone Encounter (Signed)
Routed to PCP 

## 2021-07-19 ENCOUNTER — Other Ambulatory Visit: Payer: Self-pay

## 2021-07-26 NOTE — Telephone Encounter (Signed)
You told patient that you would send refills since he is not due for appt until may.

## 2021-08-14 ENCOUNTER — Telehealth (INDEPENDENT_AMBULATORY_CARE_PROVIDER_SITE_OTHER): Payer: Self-pay | Admitting: Primary Care

## 2021-08-14 NOTE — Telephone Encounter (Signed)
Pt came into office to request medicine refill for ? hydrOXYzine (ATARAX) 10 MG tablet. [place] Spaniel he has a rash and still bothers him. Advised of appt with Marcelino Duster next month. Advised 24/48 hrs to hear something back from CMA/PCP. ?

## 2021-08-14 NOTE — Telephone Encounter (Signed)
Request routed to PCP ?

## 2021-08-15 ENCOUNTER — Other Ambulatory Visit (INDEPENDENT_AMBULATORY_CARE_PROVIDER_SITE_OTHER): Payer: Self-pay | Admitting: Primary Care

## 2021-08-15 ENCOUNTER — Other Ambulatory Visit: Payer: Self-pay

## 2021-08-15 MED ORDER — HYDROXYZINE HCL 10 MG PO TABS
ORAL_TABLET | ORAL | 0 refills | Status: DC
  Filled 2021-08-15: qty 60, 20d supply, fill #0

## 2021-08-28 ENCOUNTER — Other Ambulatory Visit: Payer: Self-pay

## 2021-10-08 ENCOUNTER — Telehealth (INDEPENDENT_AMBULATORY_CARE_PROVIDER_SITE_OTHER): Payer: Self-pay | Admitting: Primary Care

## 2021-10-08 NOTE — Telephone Encounter (Signed)
Attempted to reach patient to inform him his visit would be a telephone visit due to staffing. Was not able to leave a message. ?

## 2021-10-09 ENCOUNTER — Encounter (INDEPENDENT_AMBULATORY_CARE_PROVIDER_SITE_OTHER): Payer: Self-pay | Admitting: Primary Care

## 2021-10-09 ENCOUNTER — Telehealth (INDEPENDENT_AMBULATORY_CARE_PROVIDER_SITE_OTHER): Payer: PRIVATE HEALTH INSURANCE | Admitting: Primary Care

## 2021-10-25 ENCOUNTER — Other Ambulatory Visit: Payer: Self-pay

## 2021-10-25 ENCOUNTER — Ambulatory Visit (INDEPENDENT_AMBULATORY_CARE_PROVIDER_SITE_OTHER): Payer: PRIVATE HEALTH INSURANCE | Admitting: Primary Care

## 2021-10-25 DIAGNOSIS — E785 Hyperlipidemia, unspecified: Secondary | ICD-10-CM | POA: Diagnosis not present

## 2021-10-25 DIAGNOSIS — F5221 Male erectile disorder: Secondary | ICD-10-CM

## 2021-10-25 DIAGNOSIS — I10 Essential (primary) hypertension: Secondary | ICD-10-CM | POA: Diagnosis not present

## 2021-10-25 DIAGNOSIS — E119 Type 2 diabetes mellitus without complications: Secondary | ICD-10-CM

## 2021-10-25 DIAGNOSIS — Z760 Encounter for issue of repeat prescription: Secondary | ICD-10-CM

## 2021-10-25 MED ORDER — METFORMIN HCL 1000 MG PO TABS
1000.0000 mg | ORAL_TABLET | Freq: Two times a day (BID) | ORAL | 3 refills | Status: DC
  Filled 2021-10-25: qty 60, 30d supply, fill #0
  Filled 2022-01-03: qty 60, 30d supply, fill #1
  Filled 2022-03-10 (×2): qty 60, 30d supply, fill #2
  Filled 2022-05-07: qty 60, 30d supply, fill #3
  Filled 2022-06-20: qty 60, 30d supply, fill #4
  Filled 2022-08-12 (×2): qty 60, 30d supply, fill #5
  Filled 2022-09-23: qty 60, 30d supply, fill #6

## 2021-10-25 MED ORDER — LISINOPRIL 2.5 MG PO TABS
2.5000 mg | ORAL_TABLET | Freq: Every day | ORAL | 1 refills | Status: DC
  Filled 2021-10-25: qty 30, 30d supply, fill #0
  Filled 2022-08-12 (×2): qty 30, 30d supply, fill #1

## 2021-10-25 MED ORDER — SILDENAFIL CITRATE 100 MG PO TABS
ORAL_TABLET | ORAL | 11 refills | Status: DC
  Filled 2021-10-25: qty 10, 30d supply, fill #0
  Filled 2022-02-19: qty 10, 30d supply, fill #1
  Filled ????-??-??: fill #2

## 2021-10-25 MED ORDER — ATORVASTATIN CALCIUM 10 MG PO TABS
ORAL_TABLET | Freq: Every day | ORAL | 3 refills | Status: DC
  Filled 2021-10-25: qty 30, 30d supply, fill #0

## 2021-10-25 NOTE — Progress Notes (Signed)
[facility]  Telephone Note  I connected with Frank Landry, on 10/25/2021 at 10:45 AM  by telephone and verified that I am speaking with the correct person using two identifiers.   Consent: I discussed the limitations, risks, security and privacy concerns of performing an evaluation and management service by telephone and the availability of in person appointments. I also discussed with the patient that there may be a patient responsible charge related to this service. The patient expressed understanding and agreed to proceed.   Location of Patient: Home  Location of Provider: [facility] [place]   Persons participating in Telemedicine visit: Takahiro Godinho Juluis Mire,  NP Carolin Coy   History of Present Illness: Frank Landry is a obese Male, 61 y.o.f/u on diabetes and medication refill. Pertinent ROS:  Polyuria - No Polydipsia - No Vision problems - No   Past Medical History:  Diagnosis Date   Diabetes mellitus without complication (HCC)    No pertinent past medical history    Allergies  Allergen Reactions   Zosyn [Piperacillin Sod-Tazobactam So] Rash    Current Outpatient Medications on File Prior to Visit  Medication Sig Dispense Refill   Colloidal Oatmeal (EUCERIN ECZEMA RELIEF) 1 % CREA Apply 156 g topically 2 (two) times daily as needed. 156 g 2   glucose monitoring kit (FREESTYLE) monitoring kit 1 each by Does not apply route as needed for other. Please include lancet and test strip . Testing once daily 1 each 0   hydrOXYzine (ATARAX) 10 MG tablet TAKE 1 TABLET (10 MG TOTAL) BY MOUTH EVERY 8 (EIGHT) HOURS AS NEEDED FOR ITCHING. 60 tablet 0   lisinopril (ZESTRIL) 2.5 MG tablet Take 1 tablet (2.5 mg total) by mouth daily. 30 tablet 1   metFORMIN (GLUCOPHAGE) 1000 MG tablet Take 1 tablet (1,000 mg total) by mouth 2 (two) times daily with a meal. 180 tablet 3    psyllium (METAMUCIL SMOOTH TEXTURE) 28 % packet Take 1 packet by mouth 2 (two) times daily. 60 packet 6   atorvastatin (LIPITOR) 10 MG tablet TAKE 1 TABLET (10 MG TOTAL) BY MOUTH DAILY. 90 tablet 3   sildenafil (VIAGRA) 100 MG tablet TAKE 0.5-1 TABLETS (50-100 MG TOTAL) BY MOUTH DAILY AS NEEDED FOR ERECTILE DYSFUNCTION. 5 tablet 11   No current facility-administered medications on file prior to visit.    Observations/Objective: Comprehensive ROS Pertinent positive and negative noted in HPI    Assessment and Plan: Frank Landry was seen today for medication refill.  Diagnoses and all orders for this visit:  Medication refill -     atorvastatin (LIPITOR) 10 MG tablet; TAKE 1 TABLET (10 MG TOTAL) BY MOUTH DAILY. -     lisinopril (ZESTRIL) 2.5 MG tablet; Take 1 tablet (2.5 mg total) by mouth daily. -     sildenafil (VIAGRA) 100 MG tablet; TAKE 0.5-1 TABLETS (50-100 MG TOTAL) BY MOUTH DAILY AS NEEDED FOR ERECTILE DYSFUNCTION.  Hyperlipidemia, unspecified hyperlipidemia type -     atorvastatin (LIPITOR) 10 MG tablet; TAKE 1 TABLET (10 MG TOTAL) BY MOUTH DAILY.  Essential hypertension BP goal - < 140/90 Explained that having normal blood pressure is the goal and medications are helping to get to goal and maintain normal blood pressure. DIET: Limit salt intake, read nutrition labels to check salt content, limit fried and high fatty foods  Avoid using multisymptom OTC cold preparations that generally contain sudafed which can rise BP. Consult with pharmacist on  best cold relief products to use for persons with HTN EXERCISE Discussed incorporating exercise such as walking - 30 minutes most days of the week and can do in 10 minute intervals     Type 2 diabetes mellitus without complication, without long-term current use of insulin (HCC) -     metFORMIN (GLUCOPHAGE) 1000 MG tablet; Take 1 tablet (1,000 mg total) by mouth 2 (two) times daily with a meal.  Erectile disorder, acquired, generalized,  moderate -     sildenafil (VIAGRA) 100 MG tablet; TAKE 0.5-1 TABLETS (50-100 MG TOTAL) BY MOUTH DAILY AS NEEDED FOR ERECTILE DYSFUNCTION.     Follow Up Instructions: 3 months   I discussed the assessment and treatment plan with the patient. The patient was provided an opportunity to ask questions and all were answered. The patient agreed with the plan and demonstrated an understanding of the instructions.   The patient was advised to call back or seek an in-person evaluation if the symptoms worsen or if the condition fails to improve as anticipated.     I provided 20 minutes total of non-face-to-face time during this encounter including median intraservice time, reviewing previous notes, investigations, ordering medications, medical decision making, coordinating care and patient verbalized understanding at the end of the visit.    This note has been created with Surveyor, quantity. Any transcriptional errors are unintentional.   Kerin Perna, NP 10/25/2021, 10:45 AM

## 2021-10-30 ENCOUNTER — Other Ambulatory Visit: Payer: Self-pay

## 2021-11-01 ENCOUNTER — Other Ambulatory Visit (INDEPENDENT_AMBULATORY_CARE_PROVIDER_SITE_OTHER): Payer: Self-pay | Admitting: Primary Care

## 2021-11-01 ENCOUNTER — Other Ambulatory Visit: Payer: Self-pay

## 2021-11-01 DIAGNOSIS — Z760 Encounter for issue of repeat prescription: Secondary | ICD-10-CM

## 2021-11-01 MED ORDER — HYDROXYZINE HCL 10 MG PO TABS
ORAL_TABLET | ORAL | 0 refills | Status: DC
  Filled 2021-11-01: qty 60, 20d supply, fill #0

## 2021-11-04 ENCOUNTER — Other Ambulatory Visit: Payer: Self-pay

## 2022-01-03 ENCOUNTER — Other Ambulatory Visit: Payer: Self-pay

## 2022-01-06 ENCOUNTER — Other Ambulatory Visit: Payer: Self-pay

## 2022-02-19 ENCOUNTER — Other Ambulatory Visit: Payer: Self-pay

## 2022-02-24 ENCOUNTER — Other Ambulatory Visit: Payer: Self-pay

## 2022-03-10 ENCOUNTER — Other Ambulatory Visit: Payer: Self-pay

## 2022-03-11 ENCOUNTER — Other Ambulatory Visit: Payer: Self-pay

## 2022-03-18 ENCOUNTER — Encounter (INDEPENDENT_AMBULATORY_CARE_PROVIDER_SITE_OTHER): Payer: Self-pay | Admitting: Primary Care

## 2022-03-18 ENCOUNTER — Other Ambulatory Visit: Payer: Self-pay

## 2022-03-18 ENCOUNTER — Ambulatory Visit (INDEPENDENT_AMBULATORY_CARE_PROVIDER_SITE_OTHER): Payer: PRIVATE HEALTH INSURANCE | Admitting: Primary Care

## 2022-03-18 VITALS — BP 124/80 | HR 75 | Temp 97.9°F | Ht 73.0 in | Wt 205.0 lb

## 2022-03-18 DIAGNOSIS — Z760 Encounter for issue of repeat prescription: Secondary | ICD-10-CM | POA: Diagnosis not present

## 2022-03-18 DIAGNOSIS — E119 Type 2 diabetes mellitus without complications: Secondary | ICD-10-CM

## 2022-03-18 DIAGNOSIS — F5221 Male erectile disorder: Secondary | ICD-10-CM | POA: Diagnosis not present

## 2022-03-18 DIAGNOSIS — M25562 Pain in left knee: Secondary | ICD-10-CM

## 2022-03-18 DIAGNOSIS — G8929 Other chronic pain: Secondary | ICD-10-CM

## 2022-03-18 LAB — POCT GLYCOSYLATED HEMOGLOBIN (HGB A1C): HbA1c, POC (controlled diabetic range): 6.8 % (ref 0.0–7.0)

## 2022-03-18 MED ORDER — DICLOFENAC SODIUM 1 % EX GEL
4.0000 g | Freq: Four times a day (QID) | CUTANEOUS | 1 refills | Status: DC
  Filled 2022-03-18: qty 100, 7d supply, fill #0

## 2022-03-18 MED ORDER — SILDENAFIL CITRATE 100 MG PO TABS
100.0000 mg | ORAL_TABLET | Freq: Every day | ORAL | 6 refills | Status: DC | PRN
  Filled 2022-03-18: qty 10, 30d supply, fill #0

## 2022-03-18 NOTE — Addendum Note (Signed)
Addended by: Juluis Mire on: 03/18/2022 05:02 PM   Modules accepted: Orders

## 2022-03-18 NOTE — Progress Notes (Signed)
[name], is a 61 y.o. male  QQV:[id number]  [id number]:[id number]  DOB - February 02, 1961  No chief complaint on file.      Subjective:   Frank Landry is a 61 y.o. male here today for a follow up visit. Patient has No headache, No chest pain, No abdominal pain - No Nausea, No new weakness tingling or numbness, No Cough - shortness of breath  No problems updated.  Allergies  Allergen Reactions   Zosyn [Piperacillin Sod-Tazobactam So] Rash    Past Medical History:  Diagnosis Date   Diabetes mellitus without complication (HCC)    No pertinent past medical history     Current Outpatient Medications on File Prior to Visit  Medication Sig Dispense Refill   atorvastatin (LIPITOR) 10 MG tablet TAKE 1 TABLET (10 MG TOTAL) BY MOUTH DAILY. 90 tablet 3   Colloidal Oatmeal (EUCERIN ECZEMA RELIEF) 1 % CREA Apply 156 g topically 2 (two) times daily as needed. 156 g 2   glucose monitoring kit (FREESTYLE) monitoring kit 1 each by Does not apply route as needed for other. Please include lancet and test strip . Testing once daily 1 each 0   hydrOXYzine (ATARAX) 10 MG tablet TAKE 1 TABLET (10 MG TOTAL) BY MOUTH EVERY 8 (EIGHT) HOURS AS NEEDED FOR ITCHING. 60 tablet 0   lisinopril (ZESTRIL) 2.5 MG tablet Take 1 tablet (2.5 mg total) by mouth daily. 30 tablet 1   metFORMIN (GLUCOPHAGE) 1000 MG tablet Take 1 tablet (1,000 mg total) by mouth 2 (two) times daily with a meal. 180 tablet 3   psyllium (METAMUCIL SMOOTH TEXTURE) 28 % packet Take 1 packet by mouth 2 (two) times daily. 60 packet 6   sildenafil (VIAGRA) 100 MG tablet TAKE 0.5-1 TABLETS (50-100 MG TOTAL) BY MOUTH DAILY AS NEEDED FOR ERECTILE DYSFUNCTION. 5 tablet 11   No current facility-administered medications on file prior to visit.    Objective:   Vitals:   03/18/22 1635  BP: 124/80  Pulse: 75  Temp: 97.9 F (36.6 C)  TempSrc: Oral  SpO2: 95%  Weight: 205 lb (93 kg)  Height: _0  (1.854 m)     Exam General appearance : Awake, alert, not in any distress. Speech Clear. Not toxic looking HEENT: Atraumatic and Normocephalic, pupils equally reactive to light and accomodation Neck: Supple, no JVD. No cervical lymphadenopathy.  Chest: Good air entry bilaterally, no added sounds  CVS: S1 S2 regular, no murmurs.  Abdomen: Bowel sounds present, Non tender and not distended with no gaurding, rigidity or rebound. Extremities: B/L Lower Ext shows no edema, both legs are warm to touch Neurology: Awake alert, and oriented X 3, CN II-XII intact, Non focal Skin: No Rash  Data Review Lab Results  Component Value Date   HGBA1C 6.8 03/18/2022   HGBA1C 6.2 (A) 04/10/2021   HGBA1C 6.4 (A) 01/09/2021    Assessment & Plan  Diagnoses and all orders for this visit:  Type 2 diabetes mellitus without complication, without long-term current use of insulin (HCC) Noncompliant with taking metformin on a regular basis A1c up to 6.8- - educated on lifestyle modifications, including but not limited to diet choices and adding exercise to daily routine.   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 No recent lab will prescribe Voltaren gel until he returns for  follow-up appointment.  Patient have been counseled extensively about nutrition and exercise. Other issues discussed during this visit include: low cholesterol diet, weight control and daily exercise, foot care, annual eye examinations at Ophthalmology, importance of adherence with medications and regular follow-up. We also discussed long term complications of uncontrolled diabetes and hypertension.   No follow-ups on file.  The patient was given clear instructions to go to ER or return to medical center if symptoms don't improve, worsen or new problems develop. The patient verbalized understanding. The patient was  told to call to get lab results if they haven't heard anything in the next week.   This note has been created with Surveyor, quantity. Any transcriptional errors are unintentional.   Kerin Perna, NP 03/18/2022, 4:58 PM

## 2022-03-18 NOTE — Progress Notes (Signed)
Left knee pain  

## 2022-03-18 NOTE — Patient Instructions (Signed)
Acute Knee Pain, Adult Acute knee pain is sudden and may be caused by damage, swelling, or irritation of the muscles and tissues that support the knee. Pain may result from: A fall. An injury to the knee from twisting motions. A hit to the knee. Infection. Acute knee pain may go away on its own with time and rest. If it does not, your health care provider may order tests to find the cause of the pain. These may include: Imaging tests, such as an X-ray, MRI, CT scan, or ultrasound. Joint aspiration. In this test, fluid is removed from the knee and evaluated. Arthroscopy. In this test, a lighted tube is inserted into the knee and an image is projected onto a TV screen. Biopsy. In this test, a sample of tissue is removed from the body and studied under a microscope. Follow these instructions at home: If you have a knee sleeve or brace:  Wear the knee sleeve or brace as told by your health care provider. Remove it only as told by your health care provider. Loosen it if your toes tingle, become numb, or turn cold and blue. Keep it clean. If the knee sleeve or brace is not waterproof: Do not let it get wet. Cover it with a watertight covering when you take a bath or shower. Activity Rest your knee. Do not do things that cause pain or make pain worse. Avoid high-impact activities or exercises, such as running, jumping rope, or doing jumping jacks. Work with a physical therapist to make a safe exercise program, as recommended by your health care provider. Do exercises as told by your physical therapist. Managing pain, stiffness, and swelling  If directed, put ice on the affected knee. To do this: If you have a removable knee sleeve or brace, remove it as told by your health care provider. Put ice in a plastic bag. Place a towel between your skin and the bag. Leave the ice on for 20 minutes, 2-3 times a day. Remove the ice if your skin turns bright red. This is very important. If you cannot  feel pain, heat, or cold, you have a greater risk of damage to the area. If directed, use an elastic bandage to put pressure (compression) on your injured knee. This may control swelling, give support, and help with discomfort. Raise (elevate) your knee above the level of your heart while you are sitting or lying down. Sleep with a pillow under your knee. General instructions Take over-the-counter and prescription medicines only as told by your health care provider. Do not use any products that contain nicotine or tobacco, such as cigarettes, e-cigarettes, and chewing tobacco. If you need help quitting, ask your health care provider. If you are overweight, work with your health care provider and a dietitian to set a weight-loss goal that is healthy and reasonable for you. Extra weight can put pressure on your knee. Pay attention to any changes in your symptoms. Keep all follow-up visits. This is important. Contact a health care provider if: Your knee pain continues, changes, or gets worse. You have a fever along with knee pain. Your knee feels warm to the touch or is red. Your knee buckles or locks up. Get help right away if: Your knee swells, and the swelling becomes worse. You cannot move your knee. You have severe pain in your knee that cannot be managed with pain medicine. Summary Acute knee pain can be caused by a fall, an injury, an infection, or damage, swelling, or irritation   of the tissues that support your knee. Your health care provider may perform tests to find out the cause of the pain. Pay attention to any changes in your symptoms. Relieve your pain with rest, medicines, light activity, and the use of ice. Get help right away if your knee swells, you cannot move your knee, or you have severe pain that cannot be managed with medicine. This information is not intended to replace advice given to you by your health care provider. Make sure you discuss any questions you have with  your health care provider. Document Revised: 11/02/2019 Document Reviewed: 11/02/2019 Elsevier Patient Education  2023 [employer]vier Inc.  

## 2022-03-19 ENCOUNTER — Other Ambulatory Visit: Payer: Self-pay

## 2022-03-19 LAB — MICROALBUMIN, URINE: Microalbumin, Urine: 12.7 ug/mL

## 2022-03-20 ENCOUNTER — Telehealth (INDEPENDENT_AMBULATORY_CARE_PROVIDER_SITE_OTHER): Payer: Self-pay

## 2022-03-20 NOTE — Telephone Encounter (Signed)
Contacted pt to go over lab results pt is aware and doesn't have any questions or concerns 
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
## 2022-05-07 ENCOUNTER — Other Ambulatory Visit (INDEPENDENT_AMBULATORY_CARE_PROVIDER_SITE_OTHER): Payer: Self-pay | Admitting: Primary Care

## 2022-05-07 ENCOUNTER — Other Ambulatory Visit: Payer: Self-pay

## 2022-05-07 DIAGNOSIS — Z760 Encounter for issue of repeat prescription: Secondary | ICD-10-CM

## 2022-05-07 MED ORDER — HYDROXYZINE HCL 10 MG PO TABS
ORAL_TABLET | ORAL | 0 refills | Status: DC
  Filled 2022-05-07: qty 60, 20d supply, fill #0

## 2022-05-07 NOTE — Telephone Encounter (Signed)
Requested Prescriptions  Pending Prescriptions Disp Refills   hydrOXYzine (ATARAX) 10 MG tablet 60 tablet 0    Sig: TAKE 1 TABLET (10 MG TOTAL) BY MOUTH EVERY 8 (EIGHT) HOURS AS NEEDED FOR ITCHING.     Ear, Nose, and Throat:  Antihistamines 2 Failed - 05/07/2022  8:17 AM      Failed - Cr in normal range and within 360 days    Creatinine, Ser  Date Value Ref Range Status  12/19/2019 0.86 0.76 - 1.27 mg/dL Final         Passed - Valid encounter within last 12 months    Recent Outpatient Visits           1 month ago Type 2 diabetes mellitus without complication, without long-term current use of insulin (HCC)   [facility] Grayce Sessions, NP   6 months ago Medication refill   [facility] Gwinda Passe P, NP   1 year ago Type 2 diabetes mellitus without complication, without long-term current use of insulin (HCC)   [facility] Gwinda Passe P, NP   1 year ago Type 2 diabetes mellitus without complication, without long-term current use of insulin (HCC)   [facility] Ivonne Andrew, NP   1 year ago Type 2 diabetes mellitus without complication, without long-term current use of insulin (HCC)   [facility] Grayce Sessions, NP       Future Appointments             In 1 month Randa Evens, Kinnie Scales, NP [facility]

## 2022-05-12 ENCOUNTER — Other Ambulatory Visit: Payer: Self-pay

## 2022-06-18 ENCOUNTER — Encounter (INDEPENDENT_AMBULATORY_CARE_PROVIDER_SITE_OTHER): Payer: Self-pay | Admitting: Primary Care

## 2022-06-18 ENCOUNTER — Ambulatory Visit (INDEPENDENT_AMBULATORY_CARE_PROVIDER_SITE_OTHER): Payer: PRIVATE HEALTH INSURANCE | Admitting: Primary Care

## 2022-06-18 ENCOUNTER — Ambulatory Visit (INDEPENDENT_AMBULATORY_CARE_PROVIDER_SITE_OTHER): Payer: Self-pay | Admitting: Primary Care

## 2022-06-18 VITALS — BP 142/90 | HR 87 | Resp 16 | Wt 190.0 lb

## 2022-06-18 DIAGNOSIS — E119 Type 2 diabetes mellitus without complications: Secondary | ICD-10-CM

## 2022-06-18 DIAGNOSIS — I10 Essential (primary) hypertension: Secondary | ICD-10-CM

## 2022-06-18 DIAGNOSIS — E785 Hyperlipidemia, unspecified: Secondary | ICD-10-CM

## 2022-06-18 NOTE — Progress Notes (Signed)
Yeager, is a 62 y.o. male  FIE:[id number]  ZYS:[id number]  DOB - 1960/06/28  Chief Complaint  Patient presents with   Diabetes   Hypertension       Subjective:   Frank Landry is a 62 y.o. male here today for a follow up for HTN- Patient has No headache, No chest pain, No abdominal pain - No Nausea, No new weakness tingling or numbness, No Cough - shortness of breath. Denies polyuria, polyphagia or vision changes.   No problems updated.  Allergies  Allergen Reactions   Zosyn [Piperacillin Sod-Tazobactam So] Rash    Past Medical History:  Diagnosis Date   Diabetes mellitus without complication (HCC)    No pertinent past medical history     Current Outpatient Medications on File Prior to Visit  Medication Sig Dispense Refill   atorvastatin (LIPITOR) 10 MG tablet TAKE 1 TABLET (10 MG TOTAL) BY MOUTH DAILY. 90 tablet 3   Colloidal Oatmeal (EUCERIN ECZEMA RELIEF) 1 % CREA Apply 156 g topically 2 (two) times daily as needed. 156 g 2   diclofenac Sodium (VOLTAREN ARTHRITIS PAIN) 1 % GEL Apply 4 g topically 4 (four) times daily. 400 g 1   glucose monitoring kit (FREESTYLE) monitoring kit 1 each by Does not apply route as needed for other. Please include lancet and test strip . Testing once daily 1 each 0   hydrOXYzine (ATARAX) 10 MG tablet TAKE 1 TABLET (10 MG TOTAL) BY MOUTH EVERY 8 (EIGHT) HOURS AS NEEDED FOR ITCHING. 60 tablet 0   lisinopril (ZESTRIL) 2.5 MG tablet Take 1 tablet (2.5 mg total) by mouth daily. 30 tablet 1   metFORMIN (GLUCOPHAGE) 1000 MG tablet Take 1 tablet (1,000 mg total) by mouth 2 (two) times daily with a meal. 180 tablet 3   psyllium (METAMUCIL SMOOTH TEXTURE) 28 % packet Take 1 packet by mouth 2 (two) times daily. 60 packet 6   sildenafil (VIAGRA) 100 MG tablet TAKE 0.5-1 TABLETS (50-100 MG TOTAL) BY MOUTH DAILY AS NEEDED FOR ERECTILE DYSFUNCTION. 10 tablet 6   No current facility-administered medications on  file prior to visit.    Objective:  Blood Pressure (Abnormal) 142/90   Pulse 87   Respiration 16   Weight 190 lb (86.2 kg)   Oxygen Saturation 100%   Body Mass Index 25.07 kg/m    Exam General appearance : Awake, alert, not in any distress. Speech Clear. Not toxic looking HEENT: Atraumatic and Normocephalic, pupils equally reactive to light and accomodation Neck: Supple, no JVD. No cervical lymphadenopathy.  Chest: Good air entry bilaterally, wheezes all 4 quadrants  CVS: S1 S2 regular, no murmurs.  Abdomen: Bowel sounds present, Non tender and not distended with no gaurding, rigidity or rebound. Extremities: B/L Lower Ext shows no edema, both legs are warm to touch Neurology: Awake alert, and oriented X 3, CN II-XII intact, Non focal Skin: No Rash  Data Review Lab Results  Component Value Date   HGBA1C 6.8 03/18/2022   HGBA1C 6.2 (A) 04/10/2021   HGBA1C 6.4 (A) 01/09/2021    Assessment & Plan  Frank Landry was seen today for diabetes and hypertension.  Diagnoses and all orders for this visit:  Essential hypertension BP goal - < 140/90 Explained that having normal blood pressure is the goal and medications are helping to get to goal and maintain normal blood pressure. DIET: Limit salt intake, read nutrition labels to check salt content, limit fried and high fatty foods  Avoid using multisymptom  OTC cold preparations that generally contain sudafed which can rise BP. Consult with pharmacist on best cold relief products to use for persons with HTN EXERCISE Discussed incorporating exercise such as walking - 30 minutes most days of the week and can do in 10 minute intervals    -     Lipid panel -     Comprehensive metabolic panel -     CBC with Differential/Platelet  Type 2 diabetes mellitus without complication, without long-term current use of insulin (HCC) - educated on lifestyle modifications, including but not limited to diet choices and adding exercise to daily  routine.   -     CBC with Differential/Platelet  Hyperlipidemia, unspecified hyperlipidemia type  Healthy lifestyle diet of fruits vegetables fish nuts whole grains and low saturated fat . Foods high in cholesterol or liver, fatty meats,cheese, butter avocados, nuts and seeds, chocolate and fried foods. -     Lipid panel    Patient have been counseled extensively about nutrition and exercise. Other issues discussed during this visit include: low cholesterol diet, weight control and daily exercise, foot care, annual eye examinations at Ophthalmology, importance of adherence with medications and regular follow-up. We also discussed long term complications of uncontrolled diabetes and hypertension.   Return in about 3 months (around 09/17/2022) for fasting DM/HTN.  The patient was given clear instructions to go to ER or return to medical center if symptoms don't improve, worsen or new problems develop. The patient verbalized understanding. The patient was told to call to get lab results if they haven't heard anything in the next week.   This note has been created with Surveyor, quantity. Any transcriptional errors are unintentional.   Kerin Perna, NP 06/22/2022, 10:04 PM

## 2022-06-19 ENCOUNTER — Other Ambulatory Visit: Payer: Self-pay

## 2022-06-19 LAB — COMPREHENSIVE METABOLIC PANEL
ALT: 16 IU/L (ref 0–44)
AST: 32 IU/L (ref 0–40)
Albumin/Globulin Ratio: 1.8 (ref 1.2–2.2)
Albumin: 4.6 g/dL (ref 3.9–4.9)
Alkaline Phosphatase: 81 IU/L (ref 44–121)
BUN/Creatinine Ratio: 12 (ref 10–24)
BUN: 10 mg/dL (ref 8–27)
Bilirubin Total: 0.7 mg/dL (ref 0.0–1.2)
CO2: 22 mmol/L (ref 20–29)
Calcium: 9.8 mg/dL (ref 8.6–10.2)
Chloride: 102 mmol/L (ref 96–106)
Creatinine, Ser: 0.86 mg/dL (ref 0.76–1.27)
Globulin, Total: 2.6 g/dL (ref 1.5–4.5)
Glucose: 94 mg/dL (ref 70–99)
Potassium: 4.4 mmol/L (ref 3.5–5.2)
Sodium: 140 mmol/L (ref 134–144)
Total Protein: 7.2 g/dL (ref 6.0–8.5)
eGFR: 97 mL/min/{1.73_m2} (ref >59)

## 2022-06-19 LAB — CBC WITH DIFFERENTIAL/PLATELET
Basophils Absolute: 0.1 10*3/uL (ref 0.0–0.2)
Basos: 1 %
EOS (ABSOLUTE): 0.1 10*3/uL (ref 0.0–0.4)
Eos: 2 %
Hematocrit: 42.7 % (ref 37.5–51.0)
Hemoglobin: 14.4 g/dL (ref 13.0–17.7)
Immature Grans (Abs): 0 10*3/uL (ref 0.0–0.1)
Immature Granulocytes: 0 %
Lymphocytes Absolute: 2 10*3/uL (ref 0.7–3.1)
Lymphs: 44 %
MCH: 32.5 pg (ref 26.6–33.0)
MCHC: 33.7 g/dL (ref 31.5–35.7)
MCV: 96 fL (ref 79–97)
Monocytes Absolute: 0.3 10*3/uL (ref 0.1–0.9)
Monocytes: 7 %
Neutrophils Absolute: 2 10*3/uL (ref 1.4–7.0)
Neutrophils: 46 %
Platelets: 236 10*3/uL (ref 150–450)
RBC: 4.43 x10E6/uL (ref 4.14–5.80)
RDW: 12.9 % (ref 11.6–15.4)
WBC: 4.5 10*3/uL (ref 3.4–10.8)

## 2022-06-19 LAB — LIPID PANEL
Chol/HDL Ratio: 2.8 ratio (ref 0.0–5.0)
Cholesterol, Total: 185 mg/dL (ref 100–199)
HDL: 66 mg/dL (ref >39)
LDL Chol Calc (NIH): 108 mg/dL — ABNORMAL HIGH (ref 0–99)
Triglycerides: 58 mg/dL (ref 0–149)
VLDL Cholesterol Cal: 11 mg/dL (ref 5–40)

## 2022-06-20 ENCOUNTER — Other Ambulatory Visit: Payer: Self-pay

## 2022-06-20 ENCOUNTER — Other Ambulatory Visit (INDEPENDENT_AMBULATORY_CARE_PROVIDER_SITE_OTHER): Payer: Self-pay | Admitting: Primary Care

## 2022-06-20 DIAGNOSIS — Z760 Encounter for issue of repeat prescription: Secondary | ICD-10-CM

## 2022-06-20 NOTE — Telephone Encounter (Signed)
Will forward to provider  

## 2022-06-22 ENCOUNTER — Encounter (INDEPENDENT_AMBULATORY_CARE_PROVIDER_SITE_OTHER): Payer: Self-pay | Admitting: Primary Care

## 2022-06-24 ENCOUNTER — Other Ambulatory Visit: Payer: Self-pay

## 2022-06-24 MED ORDER — HYDROXYZINE HCL 10 MG PO TABS
10.0000 mg | ORAL_TABLET | Freq: Three times a day (TID) | ORAL | 0 refills | Status: DC | PRN
  Filled 2022-06-24: qty 60, 20d supply, fill #0

## 2022-06-25 ENCOUNTER — Other Ambulatory Visit: Payer: Self-pay

## 2022-06-27 ENCOUNTER — Telehealth: Payer: Self-pay | Admitting: *Deleted

## 2022-06-27 ENCOUNTER — Other Ambulatory Visit: Payer: Self-pay

## 2022-06-27 DIAGNOSIS — E785 Hyperlipidemia, unspecified: Secondary | ICD-10-CM

## 2022-06-27 DIAGNOSIS — Z760 Encounter for issue of repeat prescription: Secondary | ICD-10-CM

## 2022-06-27 MED ORDER — ATORVASTATIN CALCIUM 10 MG PO TABS
10.0000 mg | ORAL_TABLET | Freq: Every day | ORAL | 3 refills | Status: DC
  Filled 2022-06-27: qty 90, 90d supply, fill #0

## 2022-06-27 NOTE — Telephone Encounter (Signed)
-----  Message from Kerin Perna, NP sent at 06/26/2022  6:12 PM EST ----- Labs are  normal except LDL. This is consider your bad cholesterol. Per medication review not taking atorvastatin 10mg  nightly.  Try to drink at least 48 oz of water per day. Work on eating a low fat, heart healthy diet and participate in regular aerobic exercise program to control as well. Exercise at least  30 minutes per day-5 days per week. Monitor eating red meat, fried foods,  junk foods, sodas, sugary foods or drinks, unhealthy snacking, alcohol or smoking.

## 2022-06-27 NOTE — Telephone Encounter (Signed)
Patient requesting refill on male enhancement medication.

## 2022-07-02 ENCOUNTER — Other Ambulatory Visit: Payer: Self-pay

## 2022-08-12 ENCOUNTER — Other Ambulatory Visit: Payer: Self-pay

## 2022-08-13 ENCOUNTER — Other Ambulatory Visit: Payer: Self-pay

## 2022-09-17 ENCOUNTER — Ambulatory Visit (INDEPENDENT_AMBULATORY_CARE_PROVIDER_SITE_OTHER): Payer: PRIVATE HEALTH INSURANCE | Admitting: Primary Care

## 2022-09-23 ENCOUNTER — Other Ambulatory Visit (INDEPENDENT_AMBULATORY_CARE_PROVIDER_SITE_OTHER): Payer: Self-pay | Admitting: Primary Care

## 2022-09-23 ENCOUNTER — Other Ambulatory Visit: Payer: Self-pay

## 2022-09-23 DIAGNOSIS — Z760 Encounter for issue of repeat prescription: Secondary | ICD-10-CM

## 2022-09-23 NOTE — Telephone Encounter (Signed)
Will forward to provider  

## 2022-09-29 MED ORDER — LISINOPRIL 2.5 MG PO TABS
2.5000 mg | ORAL_TABLET | Freq: Every day | ORAL | 1 refills | Status: DC
  Filled 2022-09-29: qty 30, 30d supply, fill #0

## 2022-09-30 ENCOUNTER — Other Ambulatory Visit: Payer: Self-pay

## 2022-10-07 ENCOUNTER — Other Ambulatory Visit: Payer: Self-pay

## 2022-10-15 ENCOUNTER — Encounter (INDEPENDENT_AMBULATORY_CARE_PROVIDER_SITE_OTHER): Payer: Self-pay | Admitting: Primary Care

## 2022-10-15 ENCOUNTER — Other Ambulatory Visit: Payer: Self-pay

## 2022-10-15 ENCOUNTER — Ambulatory Visit (INDEPENDENT_AMBULATORY_CARE_PROVIDER_SITE_OTHER): Payer: PRIVATE HEALTH INSURANCE | Admitting: Primary Care

## 2022-10-15 VITALS — BP 136/84 | HR 66 | Resp 16 | Ht 73.0 in | Wt 194.8 lb

## 2022-10-15 DIAGNOSIS — Z794 Long term (current) use of insulin: Secondary | ICD-10-CM

## 2022-10-15 DIAGNOSIS — E119 Type 2 diabetes mellitus without complications: Secondary | ICD-10-CM

## 2022-10-15 DIAGNOSIS — G8929 Other chronic pain: Secondary | ICD-10-CM

## 2022-10-15 DIAGNOSIS — I10 Essential (primary) hypertension: Secondary | ICD-10-CM | POA: Diagnosis not present

## 2022-10-15 DIAGNOSIS — E785 Hyperlipidemia, unspecified: Secondary | ICD-10-CM | POA: Diagnosis not present

## 2022-10-15 DIAGNOSIS — Z7984 Long term (current) use of oral hypoglycemic drugs: Secondary | ICD-10-CM

## 2022-10-15 DIAGNOSIS — L602 Onychogryphosis: Secondary | ICD-10-CM

## 2022-10-15 DIAGNOSIS — F5221 Male erectile disorder: Secondary | ICD-10-CM

## 2022-10-15 DIAGNOSIS — Z23 Encounter for immunization: Secondary | ICD-10-CM

## 2022-10-15 DIAGNOSIS — Z760 Encounter for issue of repeat prescription: Secondary | ICD-10-CM | POA: Diagnosis not present

## 2022-10-15 DIAGNOSIS — M25562 Pain in left knee: Secondary | ICD-10-CM

## 2022-10-15 LAB — POCT GLYCOSYLATED HEMOGLOBIN (HGB A1C): HbA1c, POC (controlled diabetic range): 6.4 % (ref 0.0–7.0)

## 2022-10-15 MED ORDER — SILDENAFIL CITRATE 100 MG PO TABS
50.0000 mg | ORAL_TABLET | Freq: Every day | ORAL | 6 refills | Status: DC | PRN
Start: 2022-10-15 — End: 2023-01-21
  Filled 2022-10-15: qty 10, 30d supply, fill #0

## 2022-10-15 MED ORDER — METFORMIN HCL 1000 MG PO TABS
1000.0000 mg | ORAL_TABLET | Freq: Two times a day (BID) | ORAL | 3 refills | Status: DC
Start: 2022-10-15 — End: 2023-01-21
  Filled 2022-10-15 – 2022-10-29 (×2): qty 180, 90d supply, fill #0

## 2022-10-15 MED ORDER — ATORVASTATIN CALCIUM 10 MG PO TABS
10.0000 mg | ORAL_TABLET | Freq: Every day | ORAL | 3 refills | Status: DC
Start: 2022-10-15 — End: 2023-12-09
  Filled 2022-10-15: qty 90, 90d supply, fill #0
  Filled 2023-02-04: qty 90, 90d supply, fill #1
  Filled 2023-05-19: qty 90, 90d supply, fill #2
  Filled 2023-09-08 (×2): qty 90, 90d supply, fill #3

## 2022-10-15 MED ORDER — LISINOPRIL 2.5 MG PO TABS
2.5000 mg | ORAL_TABLET | Freq: Every day | ORAL | 1 refills | Status: DC
Start: 2022-10-15 — End: 2023-01-21
  Filled 2022-10-15: qty 60, 60d supply, fill #0

## 2022-10-15 MED ORDER — HYDROXYZINE HCL 10 MG PO TABS
10.0000 mg | ORAL_TABLET | Freq: Three times a day (TID) | ORAL | 1 refills | Status: DC | PRN
Start: 2022-10-15 — End: 2023-01-21
  Filled 2022-10-15: qty 60, 20d supply, fill #0

## 2022-10-15 NOTE — Progress Notes (Signed)
Subjective:  Patient ID: Frank Landry, male    DOB: 1961/04/24  Age: 62 y.o. MRN: [id number]  CC: Diabetes and Hypertension   HPI Frank Landry presents for follow-up of diabetes. Patient does not check blood sugar at home.  Compliant with meds - Yes Checking CBGs? No  Fasting avg -   Postprandial average -  Exercising regularly? - Yes Watching carbohydrate intake? - Yes Neuropathy ? - Yes Hypoglycemic events - No  - Recovers with :   Pertinent ROS:  Polyuria - No Polydipsia - No Vision problems - No  Medications as noted below. Taking them regularly without complication/adverse reaction being reported today.  Patient has No headache, No chest pain, No abdominal pain - No Nausea, No new weakness tingling or numbness, No Cough - shortness of breath  History [name] has a past medical history of Diabetes mellitus without complication (HCC) and No pertinent past medical history.   He has a past surgical history that includes No past surgeries and Hernia repair.   His family history includes Cancer in his father; Diabetes in his mother.He reports that he has quit smoking. His smoking use included cigarettes. He has a 9.50 pack-year smoking history. He has never used smokeless tobacco. He reports that he does not drink alcohol and does not use drugs.  Current Outpatient Medications on File Prior to Visit  Medication Sig Dispense Refill   Colloidal Oatmeal (EUCERIN ECZEMA RELIEF) 1 % CREA Apply 156 g topically 2 (two) times daily as needed. 156 g 2   glucose monitoring kit (FREESTYLE) monitoring kit 1 each by Does not apply route as needed for other. Please include lancet and test strip . Testing once daily 1 each 0   psyllium (METAMUCIL SMOOTH TEXTURE) 28 % packet Take 1 packet by mouth 2 (two) times daily. 60 packet 6   No current facility-administered medications on file prior to visit.    ROS Comprehensive ROS Pertinent positive and negative noted  in HPI    Objective:  Blood Pressure 136/84 (BP Location: Right Arm, Patient Position: Sitting, Cuff Size: Normal)   Pulse 66   Respiration 16   Height 6\' 1"  (1.854 m)   Weight 194 lb 12.8 oz (88.4 kg)   Oxygen Saturation 100%   Body Mass Index 25.70 kg/m   BP Readings from Last 3 Encounters:  10/15/22 136/84  06/18/22 (Abnormal) 142/90  03/18/22 124/80    Wt Readings from Last 3 Encounters:  10/15/22 194 lb 12.8 oz (88.4 kg)  06/18/22 190 lb (86.2 kg)  03/18/22 205 lb (93 kg)    Physical Exam General: No apparent distress. Eyes: Extraocular eye movements intact, pupils equal and round. Neck: Supple, trachea midline. Thyroid: No enlargement, mobile without fixation, no tenderness. Cardiovascular: Regular rhythm and rate, no murmur, normal radial pulses. Respiratory: Normal respiratory effort, clear to auscultation. Gastrointestinal: Normal pitch active bowel sounds, nontender abdomen without distention or appreciable hepatomegaly. Musculoskeletal: Normal muscle tone, no tenderness on palpation of tibia, no excessive thoracic kyphosis. Skin: Appropriate warmth, no visible rash. Mental status: Alert, conversant, speech clear, thought logical, appropriate mood and affect, no hallucinations or delusions evident. Hematologic/lymphatic: No cervical adenopathy, no visible ecchymoses.  Lab Results  Component Value Date   HGBA1C 6.4 10/15/2022   HGBA1C 6.8 03/18/2022   HGBA1C 6.2 (A) 04/10/2021    Lab Results  Component Value Date   WBC 4.5 06/18/2022   HGB 14.4 06/18/2022   HCT 42.7 06/18/2022   PLT 236 06/18/2022  GLUCOSE 94 06/18/2022   CHOL 185 06/18/2022   TRIG 58 06/18/2022   HDL 66 06/18/2022   LDLCALC 108 (H) 06/18/2022   ALT 16 06/18/2022   AST 32 06/18/2022   NA 140 06/18/2022   K 4.4 06/18/2022   CL 102 06/18/2022   CREATININE 0.86 06/18/2022   BUN 10 06/18/2022   CO2 22 06/18/2022   TSH 2.310 04/13/2017   HGBA1C 6.4 10/15/2022     Assessment &  Plan:   [name] was seen today for diabetes and hypertension.  Diagnoses and all orders for this visit:  Type 2 diabetes mellitus without complication, without long-term current use of insulin (HCC) -     POCT glycosylated hemoglobin (Hb A1C) -     Microalbumin / creatinine urine ratio  Varicella vaccine -     Varicella-zoster vaccine IM  Essential hypertension BP goal - < 130/80 Explained that having normal blood pressure is the goal and medications are helping to get to goal and maintain normal blood pressure. DIET: Limit salt intake, read nutrition labels to check salt content, limit fried and high fatty foods  Avoid using multisymptom OTC cold preparations that generally contain sudafed which can rise BP. Consult with pharmacist on best cold relief products to use for persons with HTN EXERCISE Discussed incorporating exercise such as walking - 30 minutes most days of the week and can do in 10 minute intervals     Onychauxis  -     Ambulatory referral to Podiatry  Erectile disorder, acquired, generalized, moderate sildenafil  reorder   Chronic pain of left knee -     Ambulatory referral to Orthopedic Surgery   I have discontinued Canary Brim. Valko's diclofenac Sodium. I am also having him maintain his glucose monitoring kit, Eucerin Eczema Relief, psyllium, hydrOXYzine, atorvastatin, lisinopril, metFORMIN, and sildenafil.  Meds ordered this encounter  Medications   hydrOXYzine (ATARAX) 10 MG tablet    Sig: TAKE 1 TABLET (10 MG TOTAL) BY MOUTH EVERY 8 (EIGHT) HOURS AS NEEDED FOR ITCHING.    Dispense:  60 tablet    Refill:  1    Order Specific Question:   Supervising Provider    Answer:   Quentin Angst [[id number]]   atorvastatin (LIPITOR) 10 MG tablet    Sig: Take 1 tablet (10 mg total) by mouth daily.    Dispense:  90 tablet    Refill:  3    Order Specific Question:   Supervising Provider    Answer:   Quentin Angst [[id number]]   lisinopril (ZESTRIL)  2.5 MG tablet    Sig: Take 1 tablet (2.5 mg total) by mouth daily.    Dispense:  30 tablet    Refill:  1    Order Specific Question:   Supervising Provider    Answer:   Quentin Angst [[id number]]   metFORMIN (GLUCOPHAGE) 1000 MG tablet    Sig: Take 1 tablet (1,000 mg total) by mouth 2 (two) times daily with a meal.    Dispense:  180 tablet    Refill:  3    Order Specific Question:   Supervising Provider    Answer:   Quentin Angst [[id number]]   sildenafil (VIAGRA) 100 MG tablet    Sig: TAKE 0.5-1 TABLETS (50-100 MG TOTAL) BY MOUTH DAILY AS NEEDED FOR ERECTILE DYSFUNCTION.    Dispense:  10 tablet    Refill:  6    Order Specific Question:   Supervising Provider    Answer:  Quentin Angst [[id number]]     Follow-up:   Return in about 3 months (around 01/15/2023) for fasting labs.  The above assessment and management plan was discussed with the patient. The patient verbalized understanding of and has agreed to the management plan. Patient is aware to call the clinic if symptoms fail to improve or worsen. Patient is aware when to return to the clinic for a follow-up visit. Patient educated on when it is appropriate to go to the emergency department.   Gwinda Passe, NP-C

## 2022-10-16 LAB — MICROALBUMIN / CREATININE URINE RATIO
Creatinine, Urine: 153.9 mg/dL
Microalb/Creat Ratio: 7 mg/g creat (ref 0–29)
Microalbumin, Urine: 10.8 ug/mL

## 2022-10-17 ENCOUNTER — Other Ambulatory Visit: Payer: Self-pay

## 2022-10-17 NOTE — Progress Notes (Signed)
   Frank Landry 1961/04/25 [id number]  Patient seen by Francetta Found, PharmD Candidate on 10/17/2022 while they were picking up prescriptions at [facility].  Blood Pressure Readings: Last documented ambulatory systolic blood pressure: 142 Last documented ambulatory diastolic blood pressure: 90 Systolic BP today: 121 Diastolic BP today: 77 HR today: 64 Does the patient have a validated home blood pressure machine?: No   Medication review was performed. Is the patient taking their medications as prescribed?: Yes   The following barriers to adherence were noted: Does the patient have cost concerns?: No Does the patient have transportation concerns?: No Does the patient need assistance obtaining refills?: No Does the patient occassionally forget to take some of their prescribed medications?: No Does the patient feel like one/some of their medications make them feel poorly?: No Does the patient have questions or concerns about their medications?: No Does the patient have a follow up scheduled with their primary care provider/cardiologist?: Yes   Interventions: Interventions Completed: Patient was educated on goal blood pressures and long term health implications of elevated blood pressure, Patient was educated on medications, including indication and administration  The patient has follow up scheduled:  PCP: Grayce Sessions, NP   Francetta Found, Student-PharmD

## 2022-10-27 NOTE — Progress Notes (Unsigned)
Office Visit Note   Patient: Frank Landry           Date of Birth: 02-Feb-1961           MRN: [id number] Visit Date: 10/28/2022              Requested by: Grayce Sessions, NP [address] [place],  [place] [address] PCP: Grayce Sessions, NP   Assessment & Plan: Visit Diagnoses:  1. Primary osteoarthritis of left knee     Plan: No show  Follow-Up Instructions: No follow-ups on file.   Orders:  No orders of the defined types were placed in this encounter.  No orders of the defined types were placed in this encounter.     Procedures: No procedures performed   Clinical Data: No additional findings.   Subjective: No chief complaint on file.   HPI  Review of Systems  Constitutional: Negative.   HENT: Negative.    Eyes: Negative.   Respiratory: Negative.    Cardiovascular: Negative.   Gastrointestinal: Negative.   Endocrine: Negative.   Genitourinary: Negative.   Skin: Negative.   Allergic/Immunologic: Negative.   Neurological: Negative.   Hematological: Negative.   Psychiatric/Behavioral: Negative.    All other systems reviewed and are negative.   Objective: Vital Signs: There were no vitals taken for this visit.  Physical Exam Vitals and nursing note reviewed.  Constitutional:      Appearance: He is well-developed.  HENT:     Head: Normocephalic and atraumatic.  Eyes:     Pupils: Pupils are equal, round, and reactive to light.  Pulmonary:     Effort: Pulmonary effort is normal.  Abdominal:     Palpations: Abdomen is soft.  Musculoskeletal:        General: Normal range of motion.     Cervical back: Neck supple.  Skin:    General: Skin is warm.  Neurological:     Mental Status: He is alert and oriented to person, place, and time.  Psychiatric:        Behavior: Behavior normal.        Thought Content: Thought content normal.        Judgment: Judgment normal.   Ortho Exam  Specialty Comments:  No specialty  comments available.  Imaging: No results found.   PMFS History: Patient Active Problem List   Diagnosis Date Noted   Type 2 diabetes mellitus without complication, without long-term current use of insulin (HCC) 01/09/2021   PVD (peripheral vascular disease) (HCC) 03/21/2020   Hyponatremia 02/26/2012   Impaired fasting glucose 02/26/2012   Adverse drug reaction to Zosyn (rash) 02/26/2012   Cellulitis 02/25/2012   Past Medical History:  Diagnosis Date   Diabetes mellitus without complication (HCC)    No pertinent past medical history     Family History  Problem Relation Age of Onset   Diabetes Mother    Cancer Father     Past Surgical History:  Procedure Laterality Date   HERNIA REPAIR     NO PAST SURGERIES     Social History   Occupational History   Not on file  Tobacco Use   Smoking status: Former    Packs/day: 0.25    Years: 38.00    Additional pack years: 0.00    Total pack years: 9.50    Types: Cigarettes   Smokeless tobacco: Never   Tobacco comments:    quit 4 years ago   Substance and Sexual Activity  Alcohol use: No   Drug use: No    Types: Marijuana    Comment: every 2 weeks- Denies 10/08/15   Sexual activity: Not on file

## 2022-10-28 ENCOUNTER — Ambulatory Visit (INDEPENDENT_AMBULATORY_CARE_PROVIDER_SITE_OTHER): Payer: PRIVATE HEALTH INSURANCE | Admitting: Orthopaedic Surgery

## 2022-10-28 DIAGNOSIS — M1712 Unilateral primary osteoarthritis, left knee: Secondary | ICD-10-CM

## 2022-10-29 ENCOUNTER — Other Ambulatory Visit: Payer: Self-pay

## 2022-11-19 ENCOUNTER — Ambulatory Visit: Payer: Self-pay | Admitting: Orthopaedic Surgery

## 2022-12-06 ENCOUNTER — Other Ambulatory Visit (HOSPITAL_BASED_OUTPATIENT_CLINIC_OR_DEPARTMENT_OTHER): Payer: Self-pay

## 2023-01-21 ENCOUNTER — Other Ambulatory Visit: Payer: Self-pay

## 2023-01-21 ENCOUNTER — Ambulatory Visit (INDEPENDENT_AMBULATORY_CARE_PROVIDER_SITE_OTHER): Payer: PRIVATE HEALTH INSURANCE | Admitting: Primary Care

## 2023-01-21 VITALS — BP 119/75 | HR 65 | Resp 16 | Ht 73.0 in | Wt 188.6 lb

## 2023-01-21 DIAGNOSIS — Z760 Encounter for issue of repeat prescription: Secondary | ICD-10-CM

## 2023-01-21 DIAGNOSIS — I10 Essential (primary) hypertension: Secondary | ICD-10-CM | POA: Diagnosis not present

## 2023-01-21 DIAGNOSIS — F5221 Male erectile disorder: Secondary | ICD-10-CM | POA: Diagnosis not present

## 2023-01-21 DIAGNOSIS — Z1211 Encounter for screening for malignant neoplasm of colon: Secondary | ICD-10-CM

## 2023-01-21 DIAGNOSIS — L299 Pruritus, unspecified: Secondary | ICD-10-CM

## 2023-01-21 DIAGNOSIS — E119 Type 2 diabetes mellitus without complications: Secondary | ICD-10-CM | POA: Diagnosis not present

## 2023-01-21 DIAGNOSIS — E11628 Type 2 diabetes mellitus with other skin complications: Secondary | ICD-10-CM | POA: Insufficient documentation

## 2023-01-21 DIAGNOSIS — Z7984 Long term (current) use of oral hypoglycemic drugs: Secondary | ICD-10-CM

## 2023-01-21 DIAGNOSIS — Z23 Encounter for immunization: Secondary | ICD-10-CM

## 2023-01-21 MED ORDER — LISINOPRIL 2.5 MG PO TABS
2.5000 mg | ORAL_TABLET | Freq: Every day | ORAL | 1 refills | Status: DC
  Filled 2023-01-21: qty 30, 30d supply, fill #0
  Filled 2023-02-25: qty 30, 30d supply, fill #1

## 2023-01-21 MED ORDER — HYDROXYZINE HCL 10 MG PO TABS
10.0000 mg | ORAL_TABLET | Freq: Three times a day (TID) | ORAL | 1 refills | Status: DC | PRN
Start: 2023-01-21 — End: 2023-04-01
  Filled 2023-01-21: qty 60, 20d supply, fill #0
  Filled 2023-02-25: qty 60, 20d supply, fill #1

## 2023-01-21 MED ORDER — SILDENAFIL CITRATE 100 MG PO TABS
50.0000 mg | ORAL_TABLET | Freq: Every day | ORAL | 6 refills | Status: DC | PRN
  Filled 2023-01-21: qty 10, 30d supply, fill #0
  Filled 2023-05-21: qty 10, 30d supply, fill #1
  Filled 2023-08-24: qty 10, 30d supply, fill #2

## 2023-01-21 MED ORDER — METFORMIN HCL 1000 MG PO TABS
1000.0000 mg | ORAL_TABLET | Freq: Two times a day (BID) | ORAL | 3 refills | Status: DC
  Filled 2023-01-21: qty 180, 90d supply, fill #0
  Filled 2023-05-19: qty 180, 90d supply, fill #1
  Filled 2023-09-08: qty 180, 90d supply, fill #2

## 2023-01-21 NOTE — Progress Notes (Unsigned)
Subjective:  Patient ID: Frank Landry, male    DOB: Dec 25, 1960  Age: 62 y.o. MRN: [id number]  CC: Diabetes (F/U diabetes needs refill on all medications)   HPI Frank Landry presents for Follow-up of diabetes. Patient does not check blood sugar at home.  Compliant with meds - Yes Checking CBGs? No  Fasting avg -   Postprandial average -  Exercising regularly? - Yes (working) Watching carbohydrate intake? - Yes Neuropathy ? - No Hypoglycemic events - No  - Recovers with :   Pertinent ROS:  Polyuria - No Polydipsia - No Vision problems - No  Medications as noted below. Taking them regularly without complication/adverse reaction being reported today.   History Frank Landry has a past medical history of Diabetes mellitus without complication (HCC) and No pertinent past medical history.   Frank Landry has a past surgical history that includes No past surgeries and Hernia repair.   His family history includes Cancer in his father; Diabetes in his mother.Frank Landry reports that Frank Landry has quit smoking. His smoking use included cigarettes. Frank Landry has a 9.5 pack-year smoking history. Frank Landry has never used smokeless tobacco. Frank Landry reports that Frank Landry does not drink alcohol and does not use drugs.  Current Outpatient Medications on File Prior to Visit  Medication Sig Dispense Refill   atorvastatin (LIPITOR) 10 MG tablet Take 1 tablet (10 mg total) by mouth daily. 90 tablet 3   Colloidal Oatmeal (EUCERIN ECZEMA RELIEF) 1 % CREA Apply 156 g topically 2 (two) times daily as needed. 156 g 2   glucose monitoring kit (FREESTYLE) monitoring kit 1 each by Does not apply route as needed for other. Please include lancet and test strip . Testing once daily 1 each 0   psyllium (METAMUCIL SMOOTH TEXTURE) 28 % packet Take 1 packet by mouth 2 (two) times daily. (Patient not taking: Reported on 01/21/2023) 60 packet 6   No current facility-administered medications on file prior to visit.    ROS Comprehensive ROS  Pertinent positive and negative noted in HPI    Objective:  Blood Pressure 119/75 (BP Location: Right Arm, Patient Position: Sitting, Cuff Size: Normal)   Pulse 65   Respiration 16   Height 6\' 1"  (1.854 m)   Weight 188 lb 9.6 oz (85.5 kg)   Oxygen Saturation 97%   Body Mass Index 24.88 kg/m   BP Readings from Last 3 Encounters:  01/21/23 119/75  10/17/22 121/77  10/15/22 136/84    Wt Readings from Last 3 Encounters:  01/21/23 188 lb 9.6 oz (85.5 kg)  10/15/22 194 lb 12.8 oz (88.4 kg)  06/18/22 190 lb (86.2 kg)    Physical Exam Vitals reviewed.  Constitutional:      Appearance: Normal appearance. Frank Landry is normal weight.  HENT:     Head: Normocephalic.     Right Ear: Tympanic membrane and external ear normal.     Left Ear: Tympanic membrane and external ear normal.     Nose: Nose normal.  Eyes:     Extraocular Movements: Extraocular movements intact.  Cardiovascular:     Rate and Rhythm: Normal rate and regular rhythm.  Pulmonary:     Effort: Pulmonary effort is normal.     Breath sounds: Normal breath sounds.  Abdominal:     General: Bowel sounds are normal.     Palpations: Abdomen is soft.  Musculoskeletal:        General: Normal range of motion.     Cervical back: Normal range of motion.  Skin:  General: Skin is warm and dry.  Neurological:     Mental Status: Frank Landry is oriented to person, place, and time.  Psychiatric:        Mood and Affect: Mood normal.        Behavior: Behavior normal.        Thought Content: Thought content normal.        Judgment: Judgment normal.    Lab Results  Component Value Date   HGBA1C 6.4 10/15/2022   HGBA1C 6.8 03/18/2022   HGBA1C 6.2 (A) 04/10/2021    Lab Results  Component Value Date   WBC 4.5 06/18/2022   HGB 14.4 06/18/2022   HCT 42.7 06/18/2022   PLT 236 06/18/2022   GLUCOSE 94 06/18/2022   CHOL 185 06/18/2022   TRIG 58 06/18/2022   HDL 66 06/18/2022   LDLCALC 108 (H) 06/18/2022   ALT 16 06/18/2022   AST 32  06/18/2022   NA 140 06/18/2022   K 4.4 06/18/2022   CL 102 06/18/2022   CREATININE 0.86 06/18/2022   BUN 10 06/18/2022   CO2 22 06/18/2022   TSH 2.310 04/13/2017   HGBA1C 6.4 10/15/2022     Assessment & Plan:  Frank Landry was seen today for diabetes.  Diagnoses and all orders for this visit:  Type 2 diabetes mellitus without complication, without long-term current use of insulin (HCC) Last A1C 6.4 3 months ago  -     HgB A1c -     Ambulatory referral to Ophthalmology -     Lipid Panel -     metFORMIN (GLUCOPHAGE) 1000 MG tablet; Take 1 tablet (1,000 mg total) by mouth 2 (two) times daily with a meal.  Colon cancer screening -     Ambulatory referral to Gastroenterology  Essential hypertension Renal protection -     lisinopril (ZESTRIL) 2.5 MG tablet; Take 1 tablet (2.5 mg total) by mouth daily.  Medication refill -     hydrOXYzine (ATARAX) 10 MG tablet; TAKE 1 TABLET (10 MG TOTAL) BY MOUTH EVERY 8 (EIGHT) HOURS AS NEEDED FOR ITCHING. -     lisinopril (ZESTRIL) 2.5 MG tablet; Take 1 tablet (2.5 mg total) by mouth daily. -     sildenafil (VIAGRA) 100 MG tablet; Take 0.5-1 tablets (50-100 mg total) by mouth daily as needed for erectile dysfunction.  Erectile disorder, acquired, generalized, moderate -     sildenafil (VIAGRA) 100 MG tablet; Take 0.5-1 tablets (50-100 mg total) by mouth daily as needed for erectile dysfunction.  Pruritus -     hydrOXYzine (ATARAX) 10 MG tablet; TAKE 1 TABLET (10 MG TOTAL) BY MOUTH EVERY 8 (EIGHT) HOURS AS NEEDED FOR ITCHING.    Follow-up:   Return in about 3 months (around 04/23/2023) for medical conditions.  The above assessment and management plan was discussed with the patient. The patient verbalized understanding of and has agreed to the management plan. Patient is aware to call the clinic if symptoms fail to improve or worsen. Patient is aware when to return to the clinic for a follow-up visit. Patient educated on when it is appropriate  to go to the emergency department.   Frank Passe, NP-C

## 2023-01-21 NOTE — Patient Instructions (Signed)

## 2023-01-22 ENCOUNTER — Encounter (INDEPENDENT_AMBULATORY_CARE_PROVIDER_SITE_OTHER): Payer: Self-pay | Admitting: Primary Care

## 2023-01-22 LAB — CMP14+EGFR
ALT: 15 IU/L (ref 0–44)
AST: 24 IU/L (ref 0–40)
Albumin: 4.2 g/dL (ref 3.9–4.9)
Alkaline Phosphatase: 75 IU/L (ref 44–121)
BUN/Creatinine Ratio: 16 (ref 10–24)
BUN: 13 mg/dL (ref 8–27)
Bilirubin Total: 0.3 mg/dL (ref 0.0–1.2)
CO2: 22 mmol/L (ref 20–29)
Calcium: 9.5 mg/dL (ref 8.6–10.2)
Chloride: 104 mmol/L (ref 96–106)
Creatinine, Ser: 0.8 mg/dL (ref 0.76–1.27)
Globulin, Total: 2.7 g/dL (ref 1.5–4.5)
Glucose: 100 mg/dL — ABNORMAL HIGH (ref 70–99)
Potassium: 4.6 mmol/L (ref 3.5–5.2)
Sodium: 139 mmol/L (ref 134–144)
Total Protein: 6.9 g/dL (ref 6.0–8.5)
eGFR: 99 mL/min/{1.73_m2} (ref >59)

## 2023-01-22 LAB — CBC WITH DIFFERENTIAL/PLATELET
Basophils Absolute: 0 10*3/uL (ref 0.0–0.2)
Basos: 1 %
EOS (ABSOLUTE): 0.1 10*3/uL (ref 0.0–0.4)
Eos: 3 %
Hematocrit: 38 % (ref 37.5–51.0)
Hemoglobin: 12.9 g/dL — ABNORMAL LOW (ref 13.0–17.7)
Immature Grans (Abs): 0 10*3/uL (ref 0.0–0.1)
Immature Granulocytes: 0 %
Lymphocytes Absolute: 1.7 10*3/uL (ref 0.7–3.1)
Lymphs: 41 %
MCH: 32.9 pg (ref 26.6–33.0)
MCHC: 33.9 g/dL (ref 31.5–35.7)
MCV: 97 fL (ref 79–97)
Monocytes Absolute: 0.3 10*3/uL (ref 0.1–0.9)
Monocytes: 8 %
Neutrophils Absolute: 1.9 10*3/uL (ref 1.4–7.0)
Neutrophils: 47 %
Platelets: 249 10*3/uL (ref 150–450)
RBC: 3.92 x10E6/uL — ABNORMAL LOW (ref 4.14–5.80)
RDW: 12.9 % (ref 11.6–15.4)
WBC: 4 10*3/uL (ref 3.4–10.8)

## 2023-01-22 LAB — HEMOGLOBIN A1C
Est. average glucose Bld gHb Est-mCnc: 137 mg/dL
Hgb A1c MFr Bld: 6.4 % — ABNORMAL HIGH (ref 4.8–5.6)

## 2023-01-22 LAB — LIPID PANEL
Chol/HDL Ratio: 2.2 ratio (ref 0.0–5.0)
Cholesterol, Total: 148 mg/dL (ref 100–199)
HDL: 67 mg/dL (ref >39)
LDL Chol Calc (NIH): 72 mg/dL (ref 0–99)
Triglycerides: 34 mg/dL (ref 0–149)
VLDL Cholesterol Cal: 9 mg/dL (ref 5–40)

## 2023-01-26 ENCOUNTER — Other Ambulatory Visit: Payer: Self-pay

## 2023-01-27 NOTE — Addendum Note (Signed)
Addended by: Herbert Deaner on: 01/27/2023 10:27 AM   Modules accepted: Orders

## 2023-01-29 ENCOUNTER — Encounter (INDEPENDENT_AMBULATORY_CARE_PROVIDER_SITE_OTHER): Payer: Self-pay

## 2023-02-04 ENCOUNTER — Other Ambulatory Visit: Payer: Self-pay

## 2023-02-06 ENCOUNTER — Other Ambulatory Visit: Payer: Self-pay

## 2023-02-25 ENCOUNTER — Other Ambulatory Visit: Payer: Self-pay

## 2023-03-02 ENCOUNTER — Other Ambulatory Visit: Payer: Self-pay

## 2023-03-06 ENCOUNTER — Other Ambulatory Visit: Payer: Self-pay | Admitting: Primary Care

## 2023-03-06 DIAGNOSIS — Z1211 Encounter for screening for malignant neoplasm of colon: Secondary | ICD-10-CM

## 2023-03-06 DIAGNOSIS — Z1212 Encounter for screening for malignant neoplasm of rectum: Secondary | ICD-10-CM

## 2023-03-13 ENCOUNTER — Other Ambulatory Visit: Payer: Self-pay

## 2023-04-01 ENCOUNTER — Other Ambulatory Visit (INDEPENDENT_AMBULATORY_CARE_PROVIDER_SITE_OTHER): Payer: Self-pay | Admitting: Primary Care

## 2023-04-01 DIAGNOSIS — Z760 Encounter for issue of repeat prescription: Secondary | ICD-10-CM

## 2023-04-01 DIAGNOSIS — I10 Essential (primary) hypertension: Secondary | ICD-10-CM

## 2023-04-01 DIAGNOSIS — L299 Pruritus, unspecified: Secondary | ICD-10-CM

## 2023-04-02 ENCOUNTER — Other Ambulatory Visit: Payer: Self-pay

## 2023-04-02 MED ORDER — LISINOPRIL 2.5 MG PO TABS
2.5000 mg | ORAL_TABLET | Freq: Every day | ORAL | 1 refills | Status: DC
Start: 2023-04-02 — End: 2023-10-21
  Filled 2023-04-02: qty 90, 90d supply, fill #0
  Filled 2023-07-20 (×2): qty 90, 90d supply, fill #1

## 2023-04-02 MED ORDER — HYDROXYZINE HCL 10 MG PO TABS
10.0000 mg | ORAL_TABLET | Freq: Three times a day (TID) | ORAL | 1 refills | Status: DC | PRN
  Filled 2023-04-02: qty 90, 30d supply, fill #0
  Filled 2023-08-24 (×2): qty 90, 30d supply, fill #1

## 2023-04-02 NOTE — Telephone Encounter (Signed)
Requested Prescriptions  Pending Prescriptions Disp Refills   hydrOXYzine (ATARAX) 10 MG tablet 90 tablet 1    Sig: TAKE 1 TABLET (10 MG TOTAL) BY MOUTH EVERY 8 (EIGHT) HOURS AS NEEDED FOR ITCHING.     Ear, Nose, and Throat:  Antihistamines 2 Passed - 04/01/2023  8:23 PM      Passed - Cr in normal range and within 360 days    Creatinine, Ser  Date Value Ref Range Status  01/21/2023 0.80 0.76 - 1.27 mg/dL Final         Passed - Valid encounter within last 12 months    Recent Outpatient Visits           2 months ago Type 2 diabetes mellitus without complication, without long-term current use of insulin (HCC)   [place] [facility] Grayce Sessions, NP   5 months ago Type 2 diabetes mellitus without complication, without long-term current use of insulin (HCC)   [place] [facility] Grayce Sessions, NP   9 months ago Essential hypertension   [place] [facility] Grayce Sessions, NP   1 year ago Type 2 diabetes mellitus without complication, without long-term current use of insulin (HCC)   [place] [facility] Grayce Sessions, NP   1 year ago Medication refill   [place] [facility] Grayce Sessions, NP       Future Appointments             In 2 weeks Grayce Sessions, NP [place] [facility]             lisinopril (ZESTRIL) 2.5 MG tablet 90 tablet 1    Sig: Take 1 tablet (2.5 mg total) by mouth daily.     Cardiovascular:  ACE Inhibitors Passed - 04/01/2023  8:23 PM      Passed - Cr in normal range and within 180 days    Creatinine, Ser  Date Value Ref Range Status  01/21/2023 0.80 0.76 - 1.27 mg/dL Final         Passed - K in normal range and within 180 days    Potassium  Date Value Ref Range Status  01/21/2023 4.6 3.5 - 5.2 mmol/L Final         Passed - Patient is not pregnant      Passed - Last BP in normal range    BP  Readings from Last 1 Encounters:  01/21/23 119/75         Passed - Valid encounter within last 6 months    Recent Outpatient Visits           2 months ago Type 2 diabetes mellitus without complication, without long-term current use of insulin (HCC)   [place] [facility] Grayce Sessions, NP   5 months ago Type 2 diabetes mellitus without complication, without long-term current use of insulin (HCC)   [place] [facility] Grayce Sessions, NP   9 months ago Essential hypertension   [place] [facility] Grayce Sessions, NP   1 year ago Type 2 diabetes mellitus without complication, without long-term current use of insulin (HCC)   [place] [facility] Grayce Sessions, NP   1 year ago Medication refill    [facility] Grayce Sessions, NP       Future Appointments  In 2 weeks Randa Evens, Kinnie Scales, NP [facility]

## 2023-04-03 ENCOUNTER — Other Ambulatory Visit: Payer: Self-pay

## 2023-04-13 LAB — COLOGUARD: COLOGUARD: NEGATIVE

## 2023-04-20 ENCOUNTER — Encounter: Payer: Self-pay | Admitting: Primary Care

## 2023-04-22 ENCOUNTER — Ambulatory Visit (INDEPENDENT_AMBULATORY_CARE_PROVIDER_SITE_OTHER): Payer: Self-pay | Admitting: Primary Care

## 2023-04-22 ENCOUNTER — Encounter (INDEPENDENT_AMBULATORY_CARE_PROVIDER_SITE_OTHER): Payer: Self-pay | Admitting: Primary Care

## 2023-04-22 VITALS — BP 126/83 | HR 71 | Resp 16 | Wt 186.0 lb

## 2023-04-22 DIAGNOSIS — E119 Type 2 diabetes mellitus without complications: Secondary | ICD-10-CM

## 2023-04-22 DIAGNOSIS — Z7984 Long term (current) use of oral hypoglycemic drugs: Secondary | ICD-10-CM

## 2023-04-22 DIAGNOSIS — Z23 Encounter for immunization: Secondary | ICD-10-CM

## 2023-04-22 LAB — POCT GLYCOSYLATED HEMOGLOBIN (HGB A1C): HbA1c, POC (controlled diabetic range): 6.1 % (ref 0.0–7.0)

## 2023-04-22 NOTE — Patient Instructions (Signed)
Zoster Vaccine Injection What is this medication? ZOSTER VACCINE (ZOS ter vak SEEN) reduces the risk of herpes zoster (shingles). It does not treat shingles. It is still possible to get shingles after receiving the vaccine, but the symptoms may be less severe or not last as long. It works by helping your immune system learn how to fight off a future infection. This medicine may be used for other purposes; ask your health care provider or pharmacist if you have questions. COMMON BRAND NAME(S): [facility] What should I tell my care team before I take this medication? They need to know if you have any of these conditions: Cancer Immune system problems An unusual or allergic reaction to Zoster vaccine, other medications, foods, dyes, or preservatives Pregnant or trying to get pregnant Breastfeeding How should I use this medication? This vaccine is injected into a muscle. It is given by your care team. This vaccine requires 2 doses to get the full benefit. Set a reminder for when your next dose is due. A copy of Vaccine Information Statements will be given before each vaccination. Be sure to read this information carefully each time. This sheet may change often. Talk to your care team about the use of this vaccine in children. This vaccine is not approved for use in children. Overdosage: If you think you have taken too much of this medicine contact a poison control center or emergency room at once. NOTE: This medicine is only for you. Do not share this medicine with others. What if I miss a dose? Keep appointments for follow-up (booster) doses. It is important not to miss your dose. Call your care team if you are unable to keep an appointment. What may interact with this medication? Medications that suppress your immune system Medications to treat cancer Steroid medications, such as prednisone or cortisone This list may not describe all possible interactions. Give your health care provider a list  of all the medicines, herbs, non-prescription drugs, or dietary supplements you use. Also tell them if you smoke, drink alcohol, or use illegal drugs. Some items may interact with your medicine. What should I watch for while using this medication? Visit your care team regularly. This vaccine, like all vaccines, may not fully protect everyone. What side effects may I notice from receiving this medication? Side effects that you should report to your care team as soon as possible: Allergic reactions--skin rash, itching, hives, swelling of the face, lips, tongue, or throat Side effects that usually do not require medical attention (report these to your care team if they continue or are bothersome): Chills Fatigue Feeling faint or lightheaded Fever Headache Muscle pain Pain, redness, or irritation at injection site This list may not describe all possible side effects. Call your doctor for medical advice about side effects. You may report side effects to FDA at 1-800-FDA-1088. Where should I keep my medication? This vaccine is only given by your care team. It will not be stored at home. NOTE: This sheet is a summary. It may not cover all possible information. If you have questions about this medicine, talk to your doctor, pharmacist, or health care provider.  2024 Elsevier/Gold Standard (2021-11-07 00:00:00)

## 2023-04-22 NOTE — Progress Notes (Signed)
[facility]  Frank Landry, is a 62 y.o. male  WJX:[id number]  OZH:[id number]  DOB - 1960/11/22  Chief Complaint  Patient presents with   Diabetes       Subjective:    Frank Landry is a 62 y.o. male here today for a follow up visit. Patient has No headache, No chest pain, No abdominal pain - No Nausea, No new weakness tingling or numbness, No Cough - shortness of breath Diabetes He has type 2 diabetes mellitus. No MedicAlert identification noted. The initial diagnosis of diabetes was made 5 years ago. There are no hypoglycemic associated symptoms. There are no hypoglycemic complications. Symptoms are stable. Diabetic complications include PVD. Risk factors for coronary artery disease include diabetes mellitus and male sex. When asked about current treatments, none were reported. He is compliant with treatment all of the time. He is following a generally healthy diet. He has not had a previous visit with a dietitian. He participates in exercise daily. There is no change in his home blood glucose trend. An ACE inhibitor/angiotensin II receptor blocker is being taken. He does not see a podiatrist.Eye exam is not current.  No problems updated.  Allergies  Allergen Reactions   Zosyn [Piperacillin Sod-Tazobactam So] Rash    Past Medical History:  Diagnosis Date   Diabetes mellitus without complication (HCC)    No pertinent past medical history     Current Outpatient Medications on File Prior to Visit  Medication Sig Dispense Refill   atorvastatin (LIPITOR) 10 MG tablet Take 1 tablet (10 mg total) by mouth daily. 90 tablet 3   Colloidal Oatmeal (EUCERIN ECZEMA RELIEF) 1 % CREA Apply 156 g topically 2 (two) times daily as needed. 156 g 2   glucose monitoring kit (FREESTYLE) monitoring kit 1 each by Does not apply route as needed for other. Please include lancet and test strip . Testing once daily 1 each 0   hydrOXYzine (ATARAX) 10 MG tablet TAKE 1 TABLET (10 MG  TOTAL) BY MOUTH EVERY 8 (EIGHT) HOURS AS NEEDED FOR ITCHING. 90 tablet 1   lisinopril (ZESTRIL) 2.5 MG tablet Take 1 tablet (2.5 mg total) by mouth daily. 90 tablet 1   metFORMIN (GLUCOPHAGE) 1000 MG tablet Take 1 tablet (1,000 mg total) by mouth 2 (two) times daily with a meal. 180 tablet 3   psyllium (METAMUCIL SMOOTH TEXTURE) 28 % packet Take 1 packet by mouth 2 (two) times daily. (Patient not taking: Reported on 01/21/2023) 60 packet 6   sildenafil (VIAGRA) 100 MG tablet Take 0.5-1 tablets (50-100 mg total) by mouth daily as needed for erectile dysfunction. 10 tablet 6   No current facility-administered medications on file prior to visit.    Objective:   Vitals:   04/22/23 0904  BP: 126/83  Pulse: 71  Resp: 16  SpO2: 99%  Weight: 186 lb (84.4 kg)    Comprehensive ROS Pertinent positive and negative noted in HPI   Exam General appearance : Awake, alert, not in any distress. Speech Clear. Not toxic looking HEENT: Atraumatic and Normocephalic, pupils equally reactive to light and accomodation Neck: Supple, no JVD. No cervical lymphadenopathy.  Chest: Good air entry bilaterally, no added sounds  CVS: S1 S2 regular, no murmurs.  Abdomen: Bowel sounds present, Non tender and not distended with no gaurding, rigidity or rebound. Extremities: B/L Lower Ext shows no edema, both legs are warm to touch Neurology: Awake alert, and oriented X 3, CN II-XII intact, Non focal Skin: No Rash  Data Review  Lab Results  Component Value Date   HGBA1C 6.1 04/22/2023   HGBA1C 6.4 (H) 01/21/2023   HGBA1C 6.4 10/15/2022    Assessment & Plan   Frank Landry was seen today for diabetes.  Diagnoses and all orders for this visit:  Type 2 diabetes mellitus without complication, without long-term current use of insulin (HCC) -     POCT glycosylated hemoglobin (Hb A1C) down to 6.1 . Change metformin to 1000mg  day - may break current tablet in 1/2 and take 1/2 AM and other 1/2 pm  Encounter for  immunization -     Varicella-zoster vaccine IM  Patient have been counseled extensively about nutrition and exercise. Other issues discussed during this visit include: low cholesterol diet, weight control and daily exercise, foot care, annual eye examinations at Ophthalmology, importance of adherence with medications and regular follow-up. We also discussed long term complications of uncontrolled diabetes and hypertension.   Return in about 6 months (around 10/20/2023).  The patient was given clear instructions to go to ER or return to medical center if symptoms don't improve, worsen or new problems develop. The patient verbalized understanding. The patient was told to call to get lab results if they haven't heard anything in the next week.   This note has been created with Education officer, environmental. Any transcriptional errors are unintentional.   Grayce Sessions, NP 04/22/2023, 9:20 AM

## 2023-04-27 ENCOUNTER — Telehealth (INDEPENDENT_AMBULATORY_CARE_PROVIDER_SITE_OTHER): Payer: Self-pay | Admitting: Primary Care

## 2023-04-27 NOTE — Telephone Encounter (Signed)
Pt had a negative colo guard but a referral was placed to [facility].  Pt thinks this referral was placed prior to the negative colo guard. Pt is not going to do the colonoscopy.  Is that OK?

## 2023-05-19 ENCOUNTER — Other Ambulatory Visit: Payer: Self-pay

## 2023-05-21 ENCOUNTER — Other Ambulatory Visit: Payer: Self-pay

## 2023-07-17 ENCOUNTER — Ambulatory Visit (HOSPITAL_COMMUNITY)
Admission: RE | Admit: 2023-07-17 | Discharge: 2023-07-17 | Disposition: A | Discharge status: Home / Self Care | Payer: Self-pay | Source: Ambulatory Visit | Attending: Emergency Medicine | Admitting: Emergency Medicine

## 2023-07-17 ENCOUNTER — Encounter (HOSPITAL_COMMUNITY): Payer: Self-pay

## 2023-07-17 VITALS — BP 152/95 | HR 77 | Temp 97.9°F | Resp 18

## 2023-07-17 DIAGNOSIS — M5441 Lumbago with sciatica, right side: Secondary | ICD-10-CM

## 2023-07-17 DIAGNOSIS — M5442 Lumbago with sciatica, left side: Secondary | ICD-10-CM

## 2023-07-17 MED ORDER — METHOCARBAMOL 500 MG PO TABS: 500.0000 mg | ORAL_TABLET | Freq: Two times a day (BID) | ORAL | 0 refills | Status: DC

## 2023-07-17 MED ORDER — DEXAMETHASONE SODIUM PHOSPHATE 10 MG/ML IJ SOLN
INTRAMUSCULAR | Status: AC
  Filled 2023-07-17: qty 1

## 2023-07-17 MED ORDER — DEXAMETHASONE SODIUM PHOSPHATE 10 MG/ML IJ SOLN
10.0000 mg | Freq: Once | INTRAMUSCULAR | Status: AC
  Administered 2023-07-17: 10 mg via INTRAMUSCULAR

## 2023-07-17 NOTE — Discharge Instructions (Signed)
You received a steroid shot in clinic today to help with inflammation causing your pain. I have prescribed a muscle relaxer to take as needed for muscle pain and spasms.  This can make you drowsy so do not drive, work, or drink while taking.  Otherwise alternate between Tylenol and ibuprofen as needed for pain.  You can also apply heat as needed for pain.  Get some rest over the next few days.  Return here as needed.

## 2023-07-17 NOTE — ED Provider Notes (Signed)
[facility]    CSN: [id number] Arrival date & time: 07/17/23  1546      History   Chief Complaint Chief Complaint  Patient presents with   Generalized Body Aches    HPI Frank Landry is a 63 y.o. male.   Patient presents with lower back pain that radiates down right and left legs.  Patient reports increased pain with movement and bending over.  Patient reports he has been taking Tylenol and ibuprofen with minimal relief.  Denies numbness and tingling.  Denies any known injury.     Past Medical History:  Diagnosis Date   Diabetes mellitus without complication (HCC)    No pertinent past medical history     Patient Active Problem List   Diagnosis Date Noted   Diabetic foot infection (HCC) 01/21/2023   Type 2 diabetes mellitus without complication, without long-term current use of insulin (HCC) 01/09/2021   PVD (peripheral vascular disease) (HCC) 03/21/2020   Hyponatremia 02/26/2012   Impaired fasting glucose 02/26/2012   Adverse drug reaction to Zosyn (rash) 02/26/2012   Cellulitis 02/25/2012    Past Surgical History:  Procedure Laterality Date   HERNIA REPAIR     NO PAST SURGERIES         Home Medications    Prior to Admission medications   Medication Sig Start Date End Date Taking? Authorizing Provider  atorvastatin (LIPITOR) 10 MG tablet Take 1 tablet (10 mg total) by mouth daily. 10/15/22  Yes Grayce Sessions, NP  Colloidal Oatmeal (EUCERIN ECZEMA RELIEF) 1 % CREA Apply 156 g topically 2 (two) times daily as needed. 12/19/19  Yes Grayce Sessions, NP  glucose monitoring kit (FREESTYLE) monitoring kit 1 each by Does not apply route as needed for other. Please include lancet and test strip . Testing once daily 03/16/19  Yes Edwards, Marcelino Duster P, NP  hydrOXYzine (ATARAX) 10 MG tablet TAKE 1 TABLET (10 MG TOTAL) BY MOUTH EVERY 8 (EIGHT) HOURS AS NEEDED FOR ITCHING. 04/02/23  Yes Grayce Sessions, NP  lisinopril (ZESTRIL) 2.5 MG  tablet Take 1 tablet (2.5 mg total) by mouth daily. 04/02/23  Yes Grayce Sessions, NP  metFORMIN (GLUCOPHAGE) 1000 MG tablet Take 1 tablet (1,000 mg total) by mouth 2 (two) times daily with a meal. 01/21/23  Yes Grayce Sessions, NP  methocarbamol (ROBAXIN) 500 MG tablet Take 1 tablet (500 mg total) by mouth 2 (two) times daily. 07/17/23  Yes Wynonia Lawman A, NP  sildenafil (VIAGRA) 100 MG tablet Take 0.5-1 tablets (50-100 mg total) by mouth daily as needed for erectile dysfunction. 01/21/23  Yes Grayce Sessions, NP  psyllium (METAMUCIL SMOOTH TEXTURE) 28 % packet Take 1 packet by mouth 2 (two) times daily. Patient not taking: Reported on 01/21/2023 09/26/20   Grayce Sessions, NP    Family History Family History  Problem Relation Age of Onset   Diabetes Mother    Cancer Father     Social History Social History   Tobacco Use   Smoking status: Former    Current packs/day: 0.25    Average packs/day: 0.3 packs/day for 38.0 years (9.5 ttl pk-yrs)    Types: Cigarettes   Smokeless tobacco: Never   Tobacco comments:    quit 4 years ago   Substance Use Topics   Alcohol use: No   Drug use: No    Types: Marijuana    Comment: every 2 weeks- Denies 10/08/15     Allergies   Zosyn [piperacillin sod-tazobactam  so]   Review of Systems Review of Systems  Per HPI  Physical Exam Triage Vital Signs ED Triage Vitals  Encounter Vitals Group     BP 07/17/23 1754 (!) 152/95     Systolic BP Percentile --      Diastolic BP Percentile --      Pulse Rate 07/17/23 1754 77     Resp 07/17/23 1754 18     Temp 07/17/23 1754 97.9 F (36.6 C)     Temp Source 07/17/23 1754 Oral     SpO2 07/17/23 1754 96 %     Weight --      Height --      Head Circumference --      Peak Flow --      Pain Score 07/17/23 1752 10     Pain Loc --      Pain Education --      Exclude from Growth Chart --    No data found.  Updated Vital Signs BP (!) 152/95 (BP Location: Right Arm)   Pulse 77    Temp 97.9 F (36.6 C) (Oral)   Resp 18   SpO2 96%   Visual Acuity Right Eye Distance:   Left Eye Distance:   Bilateral Distance:    Right Eye Near:   Left Eye Near:    Bilateral Near:     Physical Exam Vitals and nursing note reviewed.  Constitutional:      General: He is awake. He is not in acute distress.    Appearance: Normal appearance. He is not ill-appearing.  Abdominal:     Tenderness: There is no right CVA tenderness or left CVA tenderness.  Musculoskeletal:     Cervical back: Normal, normal range of motion and neck supple.     Thoracic back: Normal.     Lumbar back: Tenderness present. No swelling, deformity or bony tenderness. Normal range of motion.  Skin:    General: Skin is warm and dry.  Neurological:     Mental Status: He is alert.  Psychiatric:        Behavior: Behavior is cooperative.      UC Treatments / Results  Labs (all labs ordered are listed, but only abnormal results are displayed) Labs Reviewed - No data to display  EKG   Radiology No results found.  Procedures Procedures (including critical care time)  Medications Ordered in UC Medications  dexamethasone (DECADRON) injection 10 mg (has no administration in time range)    Initial Impression / Assessment and Plan / UC Course  I have reviewed the triage vital signs and the nursing notes.  Pertinent labs & imaging results that were available during my care of the patient were reviewed by me and considered in my medical decision making (see chart for details).     Patient presented with low back pain that radiates down right and left lower extremities.  Reports increased pain with movement and bending over.  Denies numbness and tingling.  Upon assessment patient is tender upon palpation to bilateral low back.  Without bony tenderness, deformity, or decreased range of motion.  Given Decadron injection in clinic.  Prescribed Robaxin as needed for muscle pain and spasms.  Discussed  return precautions. Final Clinical Impressions(s) / UC Diagnoses   Final diagnoses:  Acute bilateral low back pain with bilateral sciatica     Discharge Instructions      You received a steroid shot in clinic today to help with inflammation causing your pain. I  have prescribed a muscle relaxer to take as needed for muscle pain and spasms.  This can make you drowsy so do not drive, work, or drink while taking.  Otherwise alternate between Tylenol and ibuprofen as needed for pain.  You can also apply heat as needed for pain.  Get some rest over the next few days.  Return here as needed.    ED Prescriptions     Medication Sig Dispense Auth. Provider   methocarbamol (ROBAXIN) 500 MG tablet Take 1 tablet (500 mg total) by mouth 2 (two) times daily. 20 tablet Wynonia Lawman A, NP      PDMP not reviewed this encounter.   Wynonia Lawman A, NP 07/17/23 [phone]

## 2023-07-17 NOTE — ED Triage Notes (Signed)
Chief Complaint: Body aches, pain with sitting for long and also going to stand. Patient having a ongoing cough since mid January.   Sick exposure: No  Onset: 3 days for the body aches.   Prescriptions or OTC medications tried: Yes- Ibuprofen and tylenol    with no relief

## 2023-07-20 ENCOUNTER — Other Ambulatory Visit: Payer: Self-pay

## 2023-07-22 ENCOUNTER — Other Ambulatory Visit: Payer: Self-pay

## 2023-07-23 ENCOUNTER — Telehealth: Payer: Self-pay

## 2023-07-23 NOTE — Telephone Encounter (Signed)
Copied from CRM [id number]. Topic: Medical Record Request - Other >> Jul 23, 2023  1:59 PM Ivette P wrote: Reason for CRM: Geenamyr from [employer] called in to verify information for Pt. For visit on 01/21/2023 Callback  [phone]

## 2023-07-23 NOTE — Telephone Encounter (Signed)
 Routing to office

## 2023-08-24 ENCOUNTER — Other Ambulatory Visit: Payer: Self-pay

## 2023-08-27 ENCOUNTER — Emergency Department (HOSPITAL_COMMUNITY): Payer: Self-pay

## 2023-08-27 ENCOUNTER — Other Ambulatory Visit: Payer: Self-pay

## 2023-08-27 ENCOUNTER — Encounter (HOSPITAL_COMMUNITY): Payer: Self-pay | Admitting: Emergency Medicine

## 2023-08-27 ENCOUNTER — Emergency Department (HOSPITAL_COMMUNITY)
Admission: EM | Admit: 2023-08-27 | Discharge: 2023-08-28 | Disposition: A | Discharge status: Home / Self Care | Payer: Self-pay | Attending: Emergency Medicine | Admitting: Emergency Medicine

## 2023-08-27 DIAGNOSIS — Z7984 Long term (current) use of oral hypoglycemic drugs: Secondary | ICD-10-CM | POA: Insufficient documentation

## 2023-08-27 DIAGNOSIS — E119 Type 2 diabetes mellitus without complications: Secondary | ICD-10-CM | POA: Insufficient documentation

## 2023-08-27 DIAGNOSIS — K2900 Acute gastritis without bleeding: Secondary | ICD-10-CM | POA: Insufficient documentation

## 2023-08-27 LAB — COMPREHENSIVE METABOLIC PANEL WITH GFR
ALT: 16 U/L (ref 0–44)
AST: 26 U/L (ref 15–41)
Albumin: 4.1 g/dL (ref 3.5–5.0)
Alkaline Phosphatase: 57 U/L (ref 38–126)
Anion gap: 10 (ref 5–15)
BUN: 6 mg/dL — ABNORMAL LOW (ref 8–23)
CO2: 24 mmol/L (ref 22–32)
Calcium: 9.8 mg/dL (ref 8.9–10.3)
Chloride: 101 mmol/L (ref 98–111)
Creatinine, Ser: 0.83 mg/dL (ref 0.61–1.24)
GFR, Estimated: >60 mL/min (ref >60)
Glucose, Bld: 131 mg/dL — ABNORMAL HIGH (ref 70–99)
Potassium: 3.8 mmol/L (ref 3.5–5.1)
Sodium: 135 mmol/L (ref 135–145)
Total Bilirubin: 1.2 mg/dL (ref 0.0–1.2)
Total Protein: 7.2 g/dL (ref 6.5–8.1)

## 2023-08-27 LAB — CBC
HCT: 39 % (ref 39.0–52.0)
Hemoglobin: 13.3 g/dL (ref 13.0–17.0)
MCH: 32.6 pg (ref 26.0–34.0)
MCHC: 34.1 g/dL (ref 30.0–36.0)
MCV: 95.6 fL (ref 80.0–100.0)
Platelets: 245 10*3/uL (ref 150–400)
RBC: 4.08 MIL/uL — ABNORMAL LOW (ref 4.22–5.81)
RDW: 14.1 % (ref 11.5–15.5)
WBC: 6.1 10*3/uL (ref 4.0–10.5)
nRBC: 0 % (ref 0.0–0.2)

## 2023-08-27 LAB — URINALYSIS, ROUTINE W REFLEX MICROSCOPIC
Bacteria, UA: NONE SEEN
Bilirubin Urine: NEGATIVE
Glucose, UA: NEGATIVE mg/dL
Hgb urine dipstick: NEGATIVE
Ketones, ur: 20 mg/dL — AB
Leukocytes,Ua: NEGATIVE
Nitrite: NEGATIVE
Protein, ur: 30 mg/dL — AB
Specific Gravity, Urine: 1.018 (ref 1.005–1.030)
pH: 7 (ref 5.0–8.0)

## 2023-08-27 LAB — LIPASE, BLOOD: Lipase: 26 U/L (ref 11–51)

## 2023-08-27 MED ORDER — IOHEXOL 350 MG/ML SOLN
75.0000 mL | Freq: Once | INTRAVENOUS | Status: AC | PRN
  Administered 2023-08-27: 75 mL via INTRAVENOUS

## 2023-08-27 MED ORDER — ONDANSETRON 4 MG PO TBDP
4.0000 mg | ORAL_TABLET | Freq: Once | ORAL | Status: AC | PRN
  Administered 2023-08-27: 4 mg via ORAL
  Filled 2023-08-27: qty 1

## 2023-08-27 NOTE — ED Triage Notes (Signed)
 Pt reports waking up with mid central cramping abdominal pain worsening when he get up and moving around. Pain is associated with NV. Emesis x 5. No chills. No fevers. No known sick exposure.

## 2023-08-27 NOTE — ED Provider Triage Note (Signed)
 Emergency Medicine Provider Triage Evaluation Note  Frank Landry , a 63 y.o. male  was evaluated in triage.  Pt complains of abdominal pain, nausea, vomiting. Reports some streaking of blood in emesis. No hx of intraabdominal surgery.  Review of Systems  Positive: Abdominal pain, nausea, vomiting Negative:   Physical Exam  BP (!) 160/91 (BP Location: Right Arm)   Pulse 76   Temp 98.8 F (37.1 C)   Resp 18   Ht 6\' 1"  (1.854 m)   Wt 82.6 kg   SpO2 97%   BMI 24.01 kg/m  Gen:   Awake, no distress   Resp:  Normal effort  MSK:   Moves extremities without difficulty  Other:  Focal ttp in epigastric region, radiating to RLQ, no rebound, rigidity, some guarding  Medical Decision Making  Medically screening exam initiated at 7:16 PM.  Appropriate orders placed.  Frank Landry was informed that the remainder of the evaluation will be completed by another provider, this initial triage assessment does not replace that evaluation, and the importance of remaining in the ED until their evaluation is complete.  Workup initiated in triage    Frank Landry 08/27/23 1919

## 2023-08-28 ENCOUNTER — Other Ambulatory Visit: Payer: Self-pay

## 2023-08-28 MED ORDER — SUCRALFATE 1 GM/10ML PO SUSP
1.0000 g | Freq: Once | ORAL | Status: AC
  Administered 2023-08-28: 1 g via ORAL
  Filled 2023-08-28: qty 10

## 2023-08-28 MED ORDER — ONDANSETRON 4 MG PO TBDP
4.0000 mg | ORAL_TABLET | Freq: Three times a day (TID) | ORAL | 0 refills | Status: AC | PRN
  Filled 2023-08-28: qty 15, 5d supply, fill #0

## 2023-08-28 MED ORDER — PANTOPRAZOLE SODIUM 20 MG PO TBEC
20.0000 mg | DELAYED_RELEASE_TABLET | Freq: Every day | ORAL | 2 refills | Status: DC
  Filled 2023-08-28: qty 30, 30d supply, fill #0
  Filled 2023-09-22 (×2): qty 30, 30d supply, fill #1
  Filled 2023-11-02: qty 30, 30d supply, fill #2

## 2023-08-28 MED ORDER — SUCRALFATE 1 GM/10ML PO SUSP
1.0000 g | Freq: Three times a day (TID) | ORAL | 0 refills | Status: DC
  Filled 2023-08-28: qty 420, 11d supply, fill #0
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
## 2023-09-08 ENCOUNTER — Other Ambulatory Visit: Payer: Self-pay

## 2023-09-08 ENCOUNTER — Other Ambulatory Visit (INDEPENDENT_AMBULATORY_CARE_PROVIDER_SITE_OTHER): Payer: Self-pay | Admitting: Primary Care

## 2023-09-08 DIAGNOSIS — L299 Pruritus, unspecified: Secondary | ICD-10-CM

## 2023-09-08 DIAGNOSIS — Z760 Encounter for issue of repeat prescription: Secondary | ICD-10-CM

## 2023-09-08 DIAGNOSIS — I10 Essential (primary) hypertension: Secondary | ICD-10-CM

## 2023-09-09 NOTE — Telephone Encounter (Signed)
 Requested medication (s) are due for refill today - provider review   Requested medication (s) are on the active medication list -yes/no  Future visit scheduled -yes  Last refill: ondansetron- no longer listed on current medication list- non delegated - outside provider                 Sucralfate- 08/28/23 - outside provider   Notes to clinic: see above  Requested Prescriptions  Pending Prescriptions Disp Refills   ondansetron (ZOFRAN-ODT) 4 MG disintegrating tablet 15 tablet 0    Sig: Take 1 tablet (4 mg total) by mouth every 8 (eight) hours as needed for up to 3 days for nausea or vomiting.     Not Delegated - Gastroenterology: Antiemetics - ondansetron Failed - 09/09/2023 10:52 AM      Failed - This refill cannot be delegated      Passed - AST in normal range and within 360 days    AST  Date Value Ref Range Status  08/27/2023 26 15 - 41 U/L Final         Passed - ALT in normal range and within 360 days    ALT  Date Value Ref Range Status  08/27/2023 16 0 - 44 U/L Final         Passed - Valid encounter within last 6 months    Recent Outpatient Visits           4 months ago Type 2 diabetes mellitus without complication, without long-term current use of insulin (HCC)   [place] [facility] Grayce Sessions, NP   7 months ago Type 2 diabetes mellitus without complication, without long-term current use of insulin (HCC)   [place] [facility] Grayce Sessions, NP   10 months ago Type 2 diabetes mellitus without complication, without long-term current use of insulin (HCC)   [place] [facility] Grayce Sessions, NP   1 year ago Essential hypertension   [place] [facility] Grayce Sessions, NP   1 year ago Type 2 diabetes mellitus without complication, without long-term current use of insulin (HCC)   [place] [facility] Grayce Sessions, NP       Future  Appointments             In 1 month Edwards, Kinnie Scales, NP [place] [facility]             sucralfate (CARAFATE) 1 GM/10ML suspension 420 mL 0    Sig: Take 10 mLs (1 g total) by mouth 4 (four) times daily -  with meals and at bedtime.     Gastroenterology: Antiacids Passed - 09/09/2023 10:52 AM      Passed - Valid encounter within last 12 months    Recent Outpatient Visits           4 months ago Type 2 diabetes mellitus without complication, without long-term current use of insulin (HCC)   [place] [facility] Grayce Sessions, NP   7 months ago Type 2 diabetes mellitus without complication, without long-term current use of insulin (HCC)   [place] [facility] Grayce Sessions, NP   10 months ago Type 2 diabetes mellitus without complication, without long-term current use of insulin [facility])   [place] [facility] Grayce Sessions, NP   1 year ago Essential hypertension    [facility] Grayce Sessions, NP  1 year ago Type 2 diabetes mellitus without complication, without long-term current use of insulin (HCC)   [place] [facility] Grayce Sessions, NP       Future Appointments             In 1 month Randa Evens, Kinnie Scales, NP [place] [facility]            Refused Prescriptions Disp Refills   hydrOXYzine (ATARAX) 10 MG tablet 90 tablet 1    Sig: TAKE 1 TABLET (10 MG TOTAL) BY MOUTH EVERY 8 (EIGHT) HOURS AS NEEDED FOR ITCHING.     Ear, Nose, and Throat:  Antihistamines 2 Passed - 09/09/2023 10:52 AM      Passed - Cr in normal range and within 360 days    Creatinine, Ser  Date Value Ref Range Status  08/27/2023 0.83 0.61 - 1.24 mg/dL Final         Passed - Valid encounter within last 12 months    Recent Outpatient Visits           4 months ago Type 2 diabetes mellitus without complication, without long-term  current use of insulin (HCC)   [place] [facility] Grayce Sessions, NP   7 months ago Type 2 diabetes mellitus without complication, without long-term current use of insulin (HCC)   [place] [facility] Grayce Sessions, NP   10 months ago Type 2 diabetes mellitus without complication, without long-term current use of insulin (HCC)   [place] [facility] Grayce Sessions, NP   1 year ago Essential hypertension   [place] [facility] Grayce Sessions, NP   1 year ago Type 2 diabetes mellitus without complication, without long-term current use of insulin (HCC)   [place] [facility] Grayce Sessions, NP       Future Appointments             In 1 month Edwards, Kinnie Scales, NP [place] [facility]             lisinopril (ZESTRIL) 2.5 MG tablet 90 tablet 1    Sig: Take 1 tablet (2.5 mg total) by mouth daily.     Cardiovascular:  ACE Inhibitors Failed - 09/09/2023 10:52 AM      Failed - Last BP in normal range    BP Readings from Last 1 Encounters:  08/28/23 (!) 158/90         Passed - Cr in normal range and within 180 days    Creatinine, Ser  Date Value Ref Range Status  08/27/2023 0.83 0.61 - 1.24 mg/dL Final         Passed - K in normal range and within 180 days    Potassium  Date Value Ref Range Status  08/27/2023 3.8 3.5 - 5.1 mmol/L Final         Passed - Patient is not pregnant      Passed - Valid encounter within last 6 months    Recent Outpatient Visits           4 months ago Type 2 diabetes mellitus without complication, without long-term current use of insulin (HCC)   [place] [facility] Grayce Sessions, NP   7 months ago Type 2 diabetes mellitus without complication, without long-term current use of insulin [facility])    [facility] Grayce Sessions, NP   10 months ago Type  2  diabetes mellitus without complication, without long-term current use of insulin (HCC)   [place] [facility] Grayce Sessions, NP   1 year ago Essential hypertension   [place] [facility] Grayce Sessions, NP   1 year ago Type 2 diabetes mellitus without complication, without long-term current use of insulin (HCC)   [place] [facility] Grayce Sessions, NP       Future Appointments             In 1 month Edwards, Kinnie Scales, NP [place] [facility]               Requested Prescriptions  Pending Prescriptions Disp Refills   ondansetron (ZOFRAN-ODT) 4 MG disintegrating tablet 15 tablet 0    Sig: Take 1 tablet (4 mg total) by mouth every 8 (eight) hours as needed for up to 3 days for nausea or vomiting.     Not Delegated - Gastroenterology: Antiemetics - ondansetron Failed - 09/09/2023 10:52 AM      Failed - This refill cannot be delegated      Passed - AST in normal range and within 360 days    AST  Date Value Ref Range Status  08/27/2023 26 15 - 41 U/L Final         Passed - ALT in normal range and within 360 days    ALT  Date Value Ref Range Status  08/27/2023 16 0 - 44 U/L Final         Passed - Valid encounter within last 6 months    Recent Outpatient Visits           4 months ago Type 2 diabetes mellitus without complication, without long-term current use of insulin (HCC)   [place] [facility] Grayce Sessions, NP   7 months ago Type 2 diabetes mellitus without complication, without long-term current use of insulin (HCC)   [place] [facility] Grayce Sessions, NP   10 months ago Type 2 diabetes mellitus without complication, without long-term current use of insulin (HCC)   [place] [facility] Grayce Sessions, NP   1 year ago Essential hypertension   [place] [facility] Grayce Sessions, NP   1 year ago Type 2 diabetes mellitus without complication, without long-term current use of insulin (HCC)   [place] [facility] Grayce Sessions, NP       Future Appointments             In 1 month Edwards, Kinnie Scales, NP [place] [facility]             sucralfate (CARAFATE) 1 GM/10ML suspension 420 mL 0    Sig: Take 10 mLs (1 g total) by mouth 4 (four) times daily -  with meals and at bedtime.     Gastroenterology: Antiacids Passed - 09/09/2023 10:52 AM      Passed - Valid encounter within last 12 months    Recent Outpatient Visits           4 months ago Type 2 diabetes mellitus without complication, without long-term current use of insulin (HCC)   [place] [facility] Grayce Sessions, NP   7 months ago Type 2 diabetes mellitus without complication, without long-term current use of insulin [facility])   [place] [facility] Grayce Sessions, NP   10 months ago  Type 2 diabetes mellitus without complication, without long-term current use of insulin (HCC)   [place] [facility] Grayce Sessions, NP   1 year ago Essential hypertension   [place] [facility] Grayce Sessions, NP   1 year ago Type 2 diabetes mellitus without complication, without long-term current use of insulin (HCC)   [place] [facility] Grayce Sessions, NP       Future Appointments             In 1 month Randa Evens, Kinnie Scales, NP [place] [facility]            Refused Prescriptions Disp Refills   hydrOXYzine (ATARAX) 10 MG tablet 90 tablet 1    Sig: TAKE 1 TABLET (10 MG TOTAL) BY MOUTH EVERY 8 (EIGHT) HOURS AS NEEDED FOR ITCHING.     Ear, Nose, and Throat:  Antihistamines 2 Passed - 09/09/2023 10:52 AM      Passed - Cr in normal range and within 360 days    Creatinine, Ser  Date Value Ref Range Status  08/27/2023 0.83 0.61 -  1.24 mg/dL Final         Passed - Valid encounter within last 12 months    Recent Outpatient Visits           4 months ago Type 2 diabetes mellitus without complication, without long-term current use of insulin (HCC)   [place] [facility] Grayce Sessions, NP   7 months ago Type 2 diabetes mellitus without complication, without long-term current use of insulin (HCC)   [place] [facility] Grayce Sessions, NP   10 months ago Type 2 diabetes mellitus without complication, without long-term current use of insulin (HCC)   [place] [facility] Grayce Sessions, NP   1 year ago Essential hypertension   [place] [facility] Grayce Sessions, NP   1 year ago Type 2 diabetes mellitus without complication, without long-term current use of insulin (HCC)   [place] [facility] Grayce Sessions, NP       Future Appointments             In 1 month Edwards, Kinnie Scales, NP [place] [facility]             lisinopril (ZESTRIL) 2.5 MG tablet 90 tablet 1    Sig: Take 1 tablet (2.5 mg total) by mouth daily.     Cardiovascular:  ACE Inhibitors Failed - 09/09/2023 10:52 AM      Failed - Last BP in normal range    BP Readings from Last 1 Encounters:  08/28/23 (!) 158/90         Passed - Cr in normal range and within 180 days    Creatinine, Ser  Date Value Ref Range Status  08/27/2023 0.83 0.61 - 1.24 mg/dL Final         Passed - K in normal range and within 180 days    Potassium  Date Value Ref Range Status  08/27/2023 3.8 3.5 - 5.1 mmol/L Final         Passed - Patient is not pregnant      Passed - Valid encounter within last 6 months    Recent Outpatient Visits           4 months ago Type 2 diabetes mellitus without complication, without long-term current use of insulin (HCC)  [place] [facility] Grayce Sessions, NP   7 months  ago Type 2 diabetes mellitus without complication, without long-term current use of insulin (HCC)   [place] [facility] Grayce Sessions, NP   10 months ago Type 2 diabetes mellitus without complication, without long-term current use of insulin (HCC)   [place] [facility] Grayce Sessions, NP   1 year ago Essential hypertension   [place] [facility] Grayce Sessions, NP   1 year ago Type 2 diabetes mellitus without complication, without long-term current use of insulin [facility])   [place] [facility] Grayce Sessions, NP       Future Appointments             In 1 month Randa Evens, Kinnie Scales, NP  [facility]

## 2023-09-09 NOTE — Telephone Encounter (Signed)
 Rx -04/02/23 #90 1RF- both- too soon Requested Prescriptions  Pending Prescriptions Disp Refills   ondansetron (ZOFRAN-ODT) 4 MG disintegrating tablet 15 tablet 0    Sig: Take 1 tablet (4 mg total) by mouth every 8 (eight) hours as needed for up to 3 days for nausea or vomiting.     Not Delegated - Gastroenterology: Antiemetics - ondansetron Failed - 09/09/2023 10:52 AM      Failed - This refill cannot be delegated      Passed - AST in normal range and within 360 days    AST  Date Value Ref Range Status  08/27/2023 26 15 - 41 U/L Final         Passed - ALT in normal range and within 360 days    ALT  Date Value Ref Range Status  08/27/2023 16 0 - 44 U/L Final         Passed - Valid encounter within last 6 months    Recent Outpatient Visits           4 months ago Type 2 diabetes mellitus without complication, without long-term current use of insulin (HCC)   [place] [facility] Grayce Sessions, NP   7 months ago Type 2 diabetes mellitus without complication, without long-term current use of insulin (HCC)   [place] [facility] Grayce Sessions, NP   10 months ago Type 2 diabetes mellitus without complication, without long-term current use of insulin (HCC)   [place] [facility] Grayce Sessions, NP   1 year ago Essential hypertension   [place] [facility] Grayce Sessions, NP   1 year ago Type 2 diabetes mellitus without complication, without long-term current use of insulin (HCC)   [place] [facility] Grayce Sessions, NP       Future Appointments             In 1 month Edwards, Kinnie Scales, NP [place] [facility]             sucralfate (CARAFATE) 1 GM/10ML suspension 420 mL 0    Sig: Take 10 mLs (1 g total) by mouth 4 (four) times daily -  with meals and at bedtime.     Gastroenterology: Antiacids Passed - 09/09/2023 10:52 AM      Passed  - Valid encounter within last 12 months    Recent Outpatient Visits           4 months ago Type 2 diabetes mellitus without complication, without long-term current use of insulin (HCC)   [place] [facility] Grayce Sessions, NP   7 months ago Type 2 diabetes mellitus without complication, without long-term current use of insulin (HCC)   [place] [facility] Grayce Sessions, NP   10 months ago Type 2 diabetes mellitus without complication, without long-term current use of insulin (HCC)   [place] [facility] Grayce Sessions, NP   1 year ago Essential hypertension   [place] [facility] Grayce Sessions, NP   1 year ago Type 2 diabetes mellitus without complication, without long-term current use of insulin [facility])   [place] [facility] Grayce Sessions, NP       Future Appointments             In 1 month Randa Evens, Kinnie Scales, NP [place] [facility]  Refused Prescriptions Disp Refills   hydrOXYzine (ATARAX) 10 MG tablet 90 tablet 1    Sig: TAKE 1 TABLET (10 MG TOTAL) BY MOUTH EVERY 8 (EIGHT) HOURS AS NEEDED FOR ITCHING.     Ear, Nose, and Throat:  Antihistamines 2 Passed - 09/09/2023 10:52 AM      Passed - Cr in normal range and within 360 days    Creatinine, Ser  Date Value Ref Range Status  08/27/2023 0.83 0.61 - 1.24 mg/dL Final         Passed - Valid encounter within last 12 months    Recent Outpatient Visits           4 months ago Type 2 diabetes mellitus without complication, without long-term current use of insulin (HCC)   [place] [facility] Grayce Sessions, NP   7 months ago Type 2 diabetes mellitus without complication, without long-term current use of insulin (HCC)   [place] [facility] Grayce Sessions, NP   10 months ago Type 2 diabetes mellitus without complication, without  long-term current use of insulin (HCC)   [place] [facility] Grayce Sessions, NP   1 year ago Essential hypertension   [place] [facility] Grayce Sessions, NP   1 year ago Type 2 diabetes mellitus without complication, without long-term current use of insulin (HCC)   [place] [facility] Grayce Sessions, NP       Future Appointments             In 1 month Edwards, Kinnie Scales, NP [place] [facility]             lisinopril (ZESTRIL) 2.5 MG tablet 90 tablet 1    Sig: Take 1 tablet (2.5 mg total) by mouth daily.     Cardiovascular:  ACE Inhibitors Failed - 09/09/2023 10:52 AM      Failed - Last BP in normal range    BP Readings from Last 1 Encounters:  08/28/23 (!) 158/90         Passed - Cr in normal range and within 180 days    Creatinine, Ser  Date Value Ref Range Status  08/27/2023 0.83 0.61 - 1.24 mg/dL Final         Passed - K in normal range and within 180 days    Potassium  Date Value Ref Range Status  08/27/2023 3.8 3.5 - 5.1 mmol/L Final         Passed - Patient is not pregnant      Passed - Valid encounter within last 6 months    Recent Outpatient Visits           4 months ago Type 2 diabetes mellitus without complication, without long-term current use of insulin (HCC)   [place] [facility] Grayce Sessions, NP   7 months ago Type 2 diabetes mellitus without complication, without long-term current use of insulin (HCC)   [place] [facility] Grayce Sessions, NP   10 months ago Type 2 diabetes mellitus without complication, without long-term current use of insulin (HCC)   [place] [facility] Grayce Sessions, NP   1 year ago Essential hypertension   [place] [facility] Grayce Sessions, NP   1 year ago Type 2 diabetes mellitus without complication, without long-term current use  of insulin [facility])   [place] [facility] Grayce Sessions,  NP       Future Appointments             In 1 month Edwards, Kinnie Scales, NP [place] [facility]

## 2023-09-10 ENCOUNTER — Other Ambulatory Visit: Payer: Self-pay

## 2023-09-10 NOTE — Telephone Encounter (Signed)
 Pt was seen in the ED on 08/27/23 and is requesting refills

## 2023-09-15 ENCOUNTER — Other Ambulatory Visit: Payer: Self-pay | Admitting: Nurse Practitioner

## 2023-09-15 ENCOUNTER — Other Ambulatory Visit: Payer: Self-pay

## 2023-09-15 DIAGNOSIS — K2900 Acute gastritis without bleeding: Secondary | ICD-10-CM

## 2023-09-15 MED ORDER — SUCRALFATE 1 GM/10ML PO SUSP
1.0000 g | Freq: Three times a day (TID) | ORAL | 0 refills | Status: DC
  Filled 2023-09-15: qty 420, 11d supply, fill #0

## 2023-09-16 ENCOUNTER — Other Ambulatory Visit: Payer: Self-pay

## 2023-09-16 ENCOUNTER — Ambulatory Visit: Payer: Self-pay | Admitting: Nurse Practitioner

## 2023-09-22 ENCOUNTER — Other Ambulatory Visit: Payer: Self-pay

## 2023-09-24 ENCOUNTER — Ambulatory Visit: Payer: Self-pay | Admitting: Physician Assistant

## 2023-09-24 NOTE — Progress Notes (Deleted)
 Patient ID: Frank Landry, male   DOB: Aug 08, 1960, 63 y.o.   MRN: [id number]  After ED visit 3/27 with abdominal pain Frank Landry is a 63 y.o. male with a past medical history listed below who presents to the emergency department with epigastric abdominal pain with nausea and nonbloody nonbilious emesis.  Pain began this morning after eating an orange.  Denies any recent fevers or infections.  Denies any diarrhea.  Reports last bowel movement was 2 days ago.  No urinary symptoms.  No chest pain or shortness of breath.  No other physical complaints.    Workup was notable for gastric wall thickening and edema consistent with gastritis versus peptic ulcer disease.  No leukocytosis or anemia noted on CBC.  Metabolic panel without significant electrolyte derangements or renal sufficiency.  Mild hyperglycemia without DKA.  No evidence of bili obstruction or pancreatitis.   Patient treated with oral Carafate  which provided relief.

## 2023-10-12 DIAGNOSIS — E119 Type 2 diabetes mellitus without complications: Secondary | ICD-10-CM | POA: Diagnosis not present

## 2023-10-12 DIAGNOSIS — H40033 Anatomical narrow angle, bilateral: Secondary | ICD-10-CM | POA: Diagnosis not present

## 2023-10-20 ENCOUNTER — Telehealth (INDEPENDENT_AMBULATORY_CARE_PROVIDER_SITE_OTHER): Payer: Self-pay | Admitting: Primary Care

## 2023-10-20 NOTE — Telephone Encounter (Signed)
 Called pt to confirm appt. Pt will be present.

## 2023-10-21 ENCOUNTER — Encounter (INDEPENDENT_AMBULATORY_CARE_PROVIDER_SITE_OTHER): Payer: Self-pay | Admitting: Primary Care

## 2023-10-21 ENCOUNTER — Ambulatory Visit (INDEPENDENT_AMBULATORY_CARE_PROVIDER_SITE_OTHER): Payer: PRIVATE HEALTH INSURANCE | Admitting: Primary Care

## 2023-10-21 ENCOUNTER — Other Ambulatory Visit: Payer: Self-pay

## 2023-10-21 VITALS — BP 152/78 | HR 67 | Resp 16 | Ht 73.0 in | Wt 172.4 lb

## 2023-10-21 DIAGNOSIS — H6123 Impacted cerumen, bilateral: Secondary | ICD-10-CM

## 2023-10-21 DIAGNOSIS — L602 Onychogryphosis: Secondary | ICD-10-CM | POA: Diagnosis not present

## 2023-10-21 DIAGNOSIS — E119 Type 2 diabetes mellitus without complications: Secondary | ICD-10-CM

## 2023-10-21 DIAGNOSIS — L299 Pruritus, unspecified: Secondary | ICD-10-CM

## 2023-10-21 DIAGNOSIS — F411 Generalized anxiety disorder: Secondary | ICD-10-CM

## 2023-10-21 DIAGNOSIS — F5221 Male erectile disorder: Secondary | ICD-10-CM

## 2023-10-21 DIAGNOSIS — Z23 Encounter for immunization: Secondary | ICD-10-CM

## 2023-10-21 DIAGNOSIS — Z760 Encounter for issue of repeat prescription: Secondary | ICD-10-CM | POA: Diagnosis not present

## 2023-10-21 DIAGNOSIS — I10 Essential (primary) hypertension: Secondary | ICD-10-CM | POA: Diagnosis not present

## 2023-10-21 DIAGNOSIS — E785 Hyperlipidemia, unspecified: Secondary | ICD-10-CM | POA: Diagnosis not present

## 2023-10-21 DIAGNOSIS — Z7984 Long term (current) use of oral hypoglycemic drugs: Secondary | ICD-10-CM | POA: Diagnosis not present

## 2023-10-21 MED ORDER — HYDROXYZINE HCL 10 MG PO TABS
10.0000 mg | ORAL_TABLET | Freq: Three times a day (TID) | ORAL | 1 refills | Status: DC | PRN
  Filled 2023-10-21: qty 90, 30d supply, fill #0

## 2023-10-21 MED ORDER — SILDENAFIL CITRATE 100 MG PO TABS
50.0000 mg | ORAL_TABLET | Freq: Every day | ORAL | 6 refills | Status: DC | PRN
  Filled 2023-10-21: qty 10, 10d supply, fill #0
  Filled 2023-11-27: qty 10, 10d supply, fill #1
  Filled 2024-01-20 – 2024-02-19 (×2): qty 10, 10d supply, fill #2

## 2023-10-21 MED ORDER — METFORMIN HCL 1000 MG PO TABS
1000.0000 mg | ORAL_TABLET | Freq: Two times a day (BID) | ORAL | 1 refills | Status: DC
  Filled 2023-10-21: qty 180, 90d supply, fill #0

## 2023-10-21 NOTE — Patient Instructions (Addendum)
 Type Date User Summary Attachment  General 10/01/2023 11:49 AM Frank Landry D LM on vmail to call back to schedule -  Note: LM on vmail to call back to schedule .  suggestion  The following note will not be printed.   Type Date User Summary Attachment  Provider Comments 09/16/2023  9:03 AM Frank Landry  Referral -  Note: Placed in [facility] [address] [place], [place] [address] PH# [phone]    Pneumococcal Conjugate Vaccine: What You Need to Know Many vaccine information statements are available in Spanish and other languages. See PromoAge.com.br. 1. Why get vaccinated? Pneumococcal conjugate vaccine can prevent pneumococcal disease. Pneumococcal disease refers to any illness caused by pneumococcal bacteria. These bacteria can cause many types of illnesses, including pneumonia, which is an infection of the lungs. Pneumococcal bacteria are one of the most common causes of pneumonia. Besides pneumonia, pneumococcal bacteria can also cause: Ear infections Sinus infections Meningitis (infection of the tissue covering the brain and spinal cord) Bacteremia (infection of the blood) Anyone can get pneumococcal disease, but children under 23 years old, people with certain medical conditions or other risk factors, and adults 65 years or older are at the highest risk. Most pneumococcal infections are mild. However, some can result in long-term problems, such as brain damage or hearing loss. Meningitis, bacteremia, and pneumonia caused by pneumococcal disease can be fatal. 2. Pneumococcal conjugate vaccine Pneumococcal conjugate vaccine helps protect against bacteria that cause pneumococcal disease. There are three pneumococcal conjugate vaccines (PCV13, PCV15, and PCV20). The different vaccines are recommended for different people based on age and medical status. Your health care provider can help you determine which type of pneumococcal conjugate vaccine, and how many doses, you should  receive. Infants and young children usually need 4 doses of pneumococcal conjugate vaccine. These doses are recommended at 2, 4, 6, and 64-10 months of age. Older children and adolescents might need pneumococcal conjugate vaccine depending on their age and medical conditions or other risk factors if they did not receive the recommended doses as infants or young children. Adults 19 through 68 years old with certain medical conditions or other risk factors who have not already received pneumococcal conjugate vaccine should receive pneumococcal conjugate vaccine. Adults 65 years or older who have not previously received pneumococcal conjugate vaccine should receive pneumococcal conjugate vaccine. Some people with certain medical conditions are also recommended to receive pneumococcal polysaccharide vaccine (a different type of pneumococcal vaccine known as PPSV23). Some adults who have previously received a pneumococcal conjugate vaccine may be recommended to receive another pneumococcal conjugate vaccine. 3. Talk with your health care provider Tell your vaccination provider if the person getting the vaccine: Has had an allergic reaction after a previous dose of any type of pneumococcal conjugate vaccine (PCV13, PCV15, PCV20, or an earlier pneumococcal conjugate vaccine known as PCV7), or to any vaccine containing diphtheria toxoid (for example, DTaP), or has any severe, life-threatening allergies In some cases, your health care provider may decide to postpone pneumococcal conjugate vaccination until a future visit. People with minor illnesses, such as a cold, may be vaccinated. People who are moderately or severely ill should usually wait until they recover. Your health care provider can give you more information. 4. Risks of a vaccine reaction Redness, swelling, pain, or tenderness where the shot is given, and fever, loss of appetite, fussiness (irritability), feeling tired, headache, muscle aches, joint  pain, and chills can happen after pneumococcal conjugate vaccination. Young children may  be at increased risk for seizures caused by fever after a pneumococcal conjugate vaccine if it is administered at the same time as inactivated influenza vaccine. Ask your health care provider for more information. People sometimes faint after medical procedures, including vaccination. Tell your provider if you feel dizzy or have vision changes or ringing in the ears. As with any medicine, there is a very remote chance of a vaccine causing a severe allergic reaction, other serious injury, or death. 5. What if there is a serious problem? An allergic reaction could occur after the vaccinated person leaves the clinic. If you see signs of a severe allergic reaction (hives, swelling of the face and throat, difficulty breathing, a fast heartbeat, dizziness, or weakness), call 9-1-1 and get the person to the nearest hospital. For other signs that concern you, call your health care provider. Adverse reactions should be reported to the Vaccine Adverse Event Reporting System (VAERS). Your health care provider will usually file this report, or you can do it yourself. Visit the VAERS website at www.vaers.LAgents.no or call 507-874-8603. VAERS is only for reporting reactions, and VAERS staff members do not give medical advice. 6. The National Vaccine Injury Compensation Program The [employer] Vaccine Injury Compensation Program (VICP) is a federal program that was created to compensate people who may have been injured by certain vaccines. Claims regarding alleged injury or death due to vaccination have a time limit for filing, which may be as short as two years. Visit the VICP website at SpiritualWord.at or call (251)442-7457 to learn about the program and about filing a claim. 7. How can I learn more? Ask your health care provider. Call your local or state health department. Visit the website of the Food and Drug  Administration (FDA) for vaccine package inserts and additional information at FinderList.no. Contact the Centers for Disease Control and Prevention (CDC): Call 838-568-3575 (1-800-CDC-INFO) or Visit CDC's website at PicCapture.uy. Source: CDC Vaccine Information Statement (Interim) Pneumococcal Conjugate Vaccine (10/11/2021) This same material is available at FootballExhibition.com.br for no charge. This information is not intended to replace advice given to you by your health care provider. Make sure you discuss any questions you have with your health care provider. Document Revised: 09/03/2022 Document Reviewed: 06/09/2022 Elsevier Patient Education  2024 [employer].

## 2023-10-21 NOTE — Progress Notes (Signed)
 [facility]  Frank Landry, is a 63 y.o. male  RUE:[id number]  JYN:[id number]  DOB - 06/07/1960  Chief Complaint  Patient presents with   Diabetes       Subjective:   Frank Landry is a 63 y.o. male here today for a follow up visit HTN. Patient has No headache, No chest pain, No abdominal pain - No Nausea, No new weakness tingling or numbness, No Cough - shortness of breath. T2D-Patient has No headache, No chest pain, No abdominal pain - No Nausea, No new weakness tingling or numbness, No Cough - shortness of breath/s   Comprehensive ROS Pertinent positive and negative noted in HPI   Allergies  Allergen Reactions   Zosyn  [Piperacillin  Sod-Tazobactam So] Rash    Past Medical History:  Diagnosis Date   Diabetes mellitus without complication (HCC)    No pertinent past medical history     Current Outpatient Medications on File Prior to Visit  Medication Sig Dispense Refill   atorvastatin  (LIPITOR) 10 MG tablet Take 1 tablet (10 mg total) by mouth daily. 90 tablet 3   Colloidal Oatmeal (EUCERIN ECZEMA RELIEF) 1 % CREA Apply 156 g topically 2 (two) times daily as needed. 156 g 2   glucose monitoring kit (FREESTYLE) monitoring kit 1 each by Does not apply route as needed for other. Please include lancet and test strip . Testing once daily 1 each 0   hydrOXYzine  (ATARAX ) 10 MG tablet TAKE 1 TABLET (10 MG TOTAL) BY MOUTH EVERY 8 (EIGHT) HOURS AS NEEDED FOR ITCHING. 90 tablet 1   lisinopril  (ZESTRIL ) 2.5 MG tablet Take 1 tablet (2.5 mg total) by mouth daily. 90 tablet 1   metFORMIN  (GLUCOPHAGE ) 1000 MG tablet Take 1 tablet (1,000 mg total) by mouth 2 (two) times daily with a meal. 180 tablet 3   methocarbamol  (ROBAXIN ) 500 MG tablet Take 1 tablet (500 mg total) by mouth 2 (two) times daily. 20 tablet 0   pantoprazole  (PROTONIX ) 20 MG tablet Take 1 tablet (20 mg total) by mouth daily. 30 tablet 2   psyllium (METAMUCIL SMOOTH TEXTURE) 28 % packet Take 1 packet by  mouth 2 (two) times daily. (Patient not taking: Reported on 01/21/2023) 60 packet 6   sildenafil  (VIAGRA ) 100 MG tablet Take 0.5-1 tablets (50-100 mg total) by mouth daily as needed for erectile dysfunction. 10 tablet 6   sucralfate  (CARAFATE ) 1 GM/10ML suspension Take 10 mLs (1 g total) by mouth 4 (four) times daily -  with meals and at bedtime. 420 mL 0   No current facility-administered medications on file prior to visit.   Health Maintenance  Topic Date Due   Medicare Annual Wellness Visit  Never done   Eye exam for diabetics  Never done   COVID-19 Vaccine (3 - 2024-25 season) 02/01/2023   Yearly kidney health urinalysis for diabetes  10/15/2023   Complete foot exam   10/15/2023   Hemoglobin A1C  10/20/2023   Flu Shot  01/01/2024   Yearly kidney function blood test for diabetes  08/26/2024   Cologuard (Stool DNA test)  04/05/2026   DTaP/Tdap/Td vaccine (3 - Td or Tdap) 01/29/2030   Pneumonia Vaccine  Completed   Hepatitis C Screening  Completed   HIV Screening  Completed   Zoster (Shingles) Vaccine  Completed   HPV Vaccine  Aged Out   Meningitis B Vaccine  Aged Out    Objective:   Vitals:   10/21/23 0910 10/21/23 0913  BP: (!) 162/85 (!) 159/85  Pulse: 67   Resp: 16   SpO2: 99%   Weight: 172 lb 6.4 oz (78.2 kg)   Height: 6\' 1"  (1.854 m)    BP Readings from Last 3 Encounters:  10/21/23 (!) 159/85  08/28/23 (!) 158/90  07/17/23 (!) 152/95      Physical Exam Vitals reviewed.  Constitutional:      Appearance: He is normal weight.  HENT:     Head: Normocephalic.     Right Ear: External ear normal. There is impacted cerumen.     Left Ear: External ear normal. There is impacted cerumen.     Nose: Nose normal.  Eyes:     Extraocular Movements: Extraocular movements intact.     Pupils: Pupils are equal, round, and reactive to light.  Cardiovascular:     Rate and Rhythm: Normal rate and regular rhythm.  Pulmonary:     Effort: Pulmonary effort is normal.     Breath  sounds: Normal breath sounds.  Abdominal:     General: Bowel sounds are normal. There is distension.     Palpations: Abdomen is soft.  Musculoskeletal:        General: Normal range of motion.  Skin:    General: Skin is warm and dry.  Neurological:     Mental Status: He is alert and oriented to person, place, and time.  Psychiatric:        Mood and Affect: Mood normal.        Behavior: Behavior normal.        Thought Content: Thought content normal.        Judgment: Judgment normal.     Assessment & Plan  Frank Landry was seen today for diabetes.  Diagnoses and all orders for this visit:  Encounter for immunization -     Pneumococcal conjugate vaccine 20-valent  Medication refill -     hydrOXYzine  (ATARAX ) 10 MG tablet; Take 1 tablet (10 mg total) by mouth every 8 (eight) hours as needed.  Erectile disorder, acquired, generalized, moderate Hold for recheck BP  Type 2 diabetes mellitus without complication, without long-term current use of insulin  (HCC) -     CBC with Differential/Platelet -     Hemoglobin A1c -     Microalbumin / creatinine urine ratio -     metFORMIN  (GLUCOPHAGE ) 1000 MG tablet; Take 1 tablet (1,000 mg total) by mouth 2 (two) times daily with a meal.  Hyperlipidemia, unspecified hyperlipidemia type -     Lipid panel  Essential hypertension BP goal - < 140/90Explained that having normal blood pressure is the goal and medications are helping to get to goal and maintain normal blood pressure. DIET: Limit salt intake, read nutrition labels to check salt content, limit fried and high fatty foods  Avoid using multisymptom OTC cold preparations that generally contain sudafed which can rise BP. Consult with pharmacist on best cold relief products to use for persons with HTN EXERCISE Discussed incorporating exercise such as walking - 30 minutes most days of the week and can do in 10 minute intervals    -     CMP14+EGFR  Pruritus/Anxiety state -     hydrOXYzine   (ATARAX ) 10 MG tablet; Take 1 tablet (10 mg total) by mouth every 8 (eight) hours as needed.  Encounter for comprehensive diabetic foot examination, type 2 diabetes mellitus (HCC) Completed   Onychauxis -     Ambulatory referral to Podiatry  Bilateral impacted cerumen Return for ear irrigation   Other orders -  sildenafil  (VIAGRA ) 100 MG tablet; Take 0.5-1 tablets (50-100 mg total) by mouth daily as needed for erectile dysfunction.     Patient have been counseled extensively about nutrition and exercise. Other issues discussed during this visit include: low cholesterol diet, weight control and daily exercise, foot care, annual eye examinations at Ophthalmology, importance of adherence with medications and regular follow-up. We also discussed long term complications of uncontrolled diabetes and hypertension.   Return in about 6 months (around 04/22/2024).  The patient was given clear instructions to go to ER or return to medical center if symptoms don't improve, worsen or new problems develop. The patient verbalized understanding. The patient was told to call to get lab results if they haven't heard anything in the next week.   This note has been created with Education officer, environmental. Any transcriptional errors are unintentional.   Marius Siemens, NP 10/21/2023, 9:21 AM

## 2023-10-22 LAB — CBC WITH DIFFERENTIAL/PLATELET
Basophils Absolute: 0 10*3/uL (ref 0.0–0.2)
Basos: 1 %
EOS (ABSOLUTE): 0.1 10*3/uL (ref 0.0–0.4)
Eos: 2 %
Hematocrit: 40.4 % (ref 37.5–51.0)
Hemoglobin: 12.6 g/dL — ABNORMAL LOW (ref 13.0–17.7)
Immature Grans (Abs): 0 10*3/uL (ref 0.0–0.1)
Immature Granulocytes: 0 %
Lymphocytes Absolute: 2.1 10*3/uL (ref 0.7–3.1)
Lymphs: 38 %
MCH: 32.1 pg (ref 26.6–33.0)
MCHC: 31.2 g/dL — ABNORMAL LOW (ref 31.5–35.7)
MCV: 103 fL — ABNORMAL HIGH (ref 79–97)
Monocytes Absolute: 0.3 10*3/uL (ref 0.1–0.9)
Monocytes: 6 %
Neutrophils Absolute: 2.9 10*3/uL (ref 1.4–7.0)
Neutrophils: 53 %
Platelets: 248 10*3/uL (ref 150–450)
RBC: 3.92 x10E6/uL — ABNORMAL LOW (ref 4.14–5.80)
RDW: 13.1 % (ref 11.6–15.4)
WBC: 5.5 10*3/uL (ref 3.4–10.8)

## 2023-10-22 LAB — CMP14+EGFR
ALT: 13 IU/L (ref 0–44)
AST: 20 IU/L (ref 0–40)
Albumin: 4.5 g/dL (ref 3.9–4.9)
Alkaline Phosphatase: 79 IU/L (ref 44–121)
BUN/Creatinine Ratio: 13 (ref 10–24)
BUN: 10 mg/dL (ref 8–27)
Bilirubin Total: 0.6 mg/dL (ref 0.0–1.2)
CO2: 24 mmol/L (ref 20–29)
Calcium: 9.5 mg/dL (ref 8.6–10.2)
Chloride: 102 mmol/L (ref 96–106)
Creatinine, Ser: 0.8 mg/dL (ref 0.76–1.27)
Globulin, Total: 2.3 g/dL (ref 1.5–4.5)
Glucose: 88 mg/dL (ref 70–99)
Potassium: 3.7 mmol/L (ref 3.5–5.2)
Sodium: 141 mmol/L (ref 134–144)
Total Protein: 6.8 g/dL (ref 6.0–8.5)
eGFR: 98 mL/min/{1.73_m2} (ref >59)

## 2023-10-22 LAB — LIPID PANEL
Chol/HDL Ratio: 2.2 ratio (ref 0.0–5.0)
Cholesterol, Total: 166 mg/dL (ref 100–199)
HDL: 74 mg/dL (ref >39)
LDL Chol Calc (NIH): 81 mg/dL (ref 0–99)
Triglycerides: 53 mg/dL (ref 0–149)
VLDL Cholesterol Cal: 11 mg/dL (ref 5–40)

## 2023-10-22 LAB — HEMOGLOBIN A1C
Est. average glucose Bld gHb Est-mCnc: 128 mg/dL
Hgb A1c MFr Bld: 6.1 % — ABNORMAL HIGH (ref 4.8–5.6)

## 2023-10-22 LAB — MICROALBUMIN / CREATININE URINE RATIO
Creatinine, Urine: 185.8 mg/dL
Microalb/Creat Ratio: 8 mg/g{creat} (ref 0–29)
Microalbumin, Urine: 15.2 ug/mL

## 2023-10-23 ENCOUNTER — Ambulatory Visit (INDEPENDENT_AMBULATORY_CARE_PROVIDER_SITE_OTHER)

## 2023-10-26 ENCOUNTER — Ambulatory Visit (INDEPENDENT_AMBULATORY_CARE_PROVIDER_SITE_OTHER): Payer: Self-pay | Admitting: Primary Care

## 2023-10-26 MED ORDER — LISINOPRIL-HYDROCHLOROTHIAZIDE 20-25 MG PO TABS
1.0000 | ORAL_TABLET | Freq: Every day | ORAL | 1 refills | Status: DC
  Filled 2023-10-26: qty 90, 90d supply, fill #0
  Filled 2024-02-10: qty 90, 90d supply, fill #1

## 2023-10-27 ENCOUNTER — Other Ambulatory Visit: Payer: Self-pay

## 2023-10-30 ENCOUNTER — Ambulatory Visit (INDEPENDENT_AMBULATORY_CARE_PROVIDER_SITE_OTHER)

## 2023-10-30 DIAGNOSIS — H6123 Impacted cerumen, bilateral: Secondary | ICD-10-CM

## 2023-10-30 NOTE — Progress Notes (Signed)
 Pt came into the office today for b/l ear irrigation

## 2023-11-02 ENCOUNTER — Other Ambulatory Visit: Payer: Self-pay

## 2023-11-03 ENCOUNTER — Telehealth (INDEPENDENT_AMBULATORY_CARE_PROVIDER_SITE_OTHER): Payer: Self-pay | Admitting: Primary Care

## 2023-11-03 NOTE — Telephone Encounter (Signed)
 Copied from CRM [id number]. Topic: Clinical - Request for Lab/Test Order >> Nov 03, 2023  2:50 PM Tiffany S wrote: Reason for CRM: Need patient BMI   [employer] [phone]

## 2023-11-04 ENCOUNTER — Ambulatory Visit (INDEPENDENT_AMBULATORY_CARE_PROVIDER_SITE_OTHER): Admitting: Primary Care

## 2023-11-04 ENCOUNTER — Encounter (INDEPENDENT_AMBULATORY_CARE_PROVIDER_SITE_OTHER): Payer: Self-pay | Admitting: Primary Care

## 2023-11-04 VITALS — BP 111/68 | HR 76 | Resp 16 | Ht 73.0 in | Wt 172.4 lb

## 2023-11-04 DIAGNOSIS — Z0000 Encounter for general adult medical examination without abnormal findings: Secondary | ICD-10-CM

## 2023-11-04 NOTE — Progress Notes (Signed)
 Subjective:    Frank Landry is a 63 y.o. male who presents for a Welcome to Medicare exam.   Cardiac Risk Factors include: none     Objective:    Today's Vitals   11/04/23 1421 11/04/23 1423  BP: 111/68   Pulse: 76   Resp: 16   SpO2: 99%   Weight: 172 lb 6.4 oz (78.2 kg)   Height: 6' 1 (1.854 m)   PainSc:  7    Body mass index is 22.75 kg/m.  Medications Outpatient Encounter Medications as of 11/04/2023  Medication Sig   atorvastatin  (LIPITOR) 10 MG tablet Take 1 tablet (10 mg total) by mouth daily.   Colloidal Oatmeal (EUCERIN ECZEMA RELIEF) 1 % CREA Apply 156 g topically 2 (two) times daily as needed.   glucose monitoring kit (FREESTYLE) monitoring kit 1 each by Does not apply route as needed for other. Please include lancet and test strip . Testing once daily   hydrOXYzine  (ATARAX ) 10 MG tablet Take 1 tablet (10 mg total) by mouth every 8 (eight) hours as needed.   lisinopril -hydrochlorothiazide  (ZESTORETIC ) 20-25 MG tablet Take 1 tablet by mouth daily.   metFORMIN  (GLUCOPHAGE ) 1000 MG tablet Take 1 tablet (1,000 mg total) by mouth 2 (two) times daily with a meal.   pantoprazole  (PROTONIX ) 20 MG tablet Take 1 tablet (20 mg total) by mouth daily.   sildenafil  (VIAGRA ) 100 MG tablet Take 0.5-1 tablets (50-100 mg total) by mouth daily as needed for erectile dysfunction.   sucralfate  (CARAFATE ) 1 GM/10ML suspension Take 10 mLs (1 g total) by mouth 4 (four) times daily -  with meals and at bedtime.   No facility-administered encounter medications on file as of 11/04/2023.     History: Past Medical History:  Diagnosis Date   Diabetes mellitus without complication (HCC)    No pertinent past medical history    Past Surgical History:  Procedure Laterality Date   HERNIA REPAIR     NO PAST SURGERIES      Family History  Problem Relation Age of Onset   Diabetes Mother    Cancer Father    Social History   Occupational History   Not on file  Tobacco Use    Smoking status: Former    Current packs/day: 0.25    Average packs/day: 0.3 packs/day for 38.0 years (9.5 ttl pk-yrs)    Types: Cigarettes   Smokeless tobacco: Never   Tobacco comments:    quit 4 years ago   Vaping Use   Vaping status: Never Used  Substance and Sexual Activity   Alcohol use: No   Drug use: No    Types: Marijuana    Comment: every 2 weeks- Denies 10/08/15   Sexual activity: Not on file    Tobacco Counseling Counseling given: Not Answered Tobacco comments: quit 4 years ago    Immunizations and Health Maintenance Immunization History  Administered Date(s) Administered   Influenza, Seasonal, Injecte, Preservative Fre 01/21/2023   Influenza,inj,Quad PF,6+ Mos 03/16/2019, 03/21/2020   PFIZER(Purple Top)SARS-COV-2 Vaccination 11/23/2019, 12/15/2019   PNEUMOCOCCAL CONJUGATE-20 10/21/2023   Tdap 07/10/2015, 01/30/2020   Zoster Recombinant(Shingrix ) 10/15/2022, 04/22/2023   There are no preventive care reminders to display for this patient.   Activities of Daily Living    11/04/2023    2:25 PM  In your present state of health, do you have any difficulty performing the following activities:  Hearing? 0  Vision? 0  Difficulty concentrating or making decisions? 1  Walking or climbing  stairs? 0  Dressing or bathing? 0  Doing errands, shopping? 0  Preparing Food and eating ? N  Using the Toilet? N  In the past six months, have you accidently leaked urine? N  Do you have problems with loss of bowel control? N  Managing your Medications? N  Managing your Finances? N  Housekeeping or managing your Housekeeping? N    Physical Exam   Physical Exam Vitals reviewed.  Constitutional:      Comments: Body mass index is 22.75 kg/m.   HENT:     Head: Normocephalic.     Right Ear: Tympanic membrane and external ear normal.     Left Ear: Tympanic membrane and external ear normal.     Nose: Nose normal.  Eyes:     Extraocular Movements: Extraocular movements intact.      Pupils: Pupils are equal, round, and reactive to light.  Cardiovascular:     Rate and Rhythm: Normal rate and regular rhythm.  Pulmonary:     Effort: Pulmonary effort is normal.     Breath sounds: Normal breath sounds.  Abdominal:     General: Bowel sounds are normal. There is distension.     Palpations: Abdomen is soft.  Musculoskeletal:        General: Normal range of motion.  Skin:    General: Skin is warm and dry.  Neurological:     Mental Status: He is oriented to person, place, and time.  Psychiatric:        Mood and Affect: Mood normal.        Behavior: Behavior normal.        Thought Content: Thought content normal.        Judgment: Judgment normal.    (optional), or other factors deemed appropriate based on the beneficiary's medical and social history and current clinical standards.   Advanced Directives: Does Patient Have a Medical Advance Directive?: No Would patient like information on creating a medical advance directive?: Yes (Inpatient - patient defers creating a medical advance directive at this time - Information given)   EKG:  Abnormal EKG asymptomatic reviewed with cardiology does not need an appointment    Assessment:    This is a routine wellness  examination for this patient . Aseem Sessums [place]   Vision/Hearing screen Vision Screening - Comments:: Pt had a vision exam at the ophthalmologist 10/12/23   Goals       Quit Smoking (pt-stated)      He wants to stop after January 02, 2024 because his family reunion is that month         Depression Screen    11/04/2023    2:33 PM 10/21/2023    9:10 AM 04/22/2023    1:59 PM 01/21/2023    8:49 AM  PHQ 2/9 Scores  PHQ - 2 Score 0 0 3 1  PHQ- 9 Score   6 3     Fall Risk    11/04/2023    2:32 PM  Fall Risk   Falls in the past year? 0  Number falls in past yr: 0  Injury with Fall? 0  Risk for fall due to : No Fall Risks  Follow up Falls evaluation completed    Cognitive Function         11/04/2023    2:33 PM  6CIT Screen  What Year? 0 points  What month? 0 points  What time? 0 points  Count back from 20 0 points  Months in  reverse 0 points  Repeat phrase 10 points  Total Score 10 points    Patient Care Team: Marius Siemens, NP as PCP - General (Internal Medicine)     Plan:     I have personally reviewed and noted the following in the patient's chart:   Medical and social history Use of alcohol, tobacco or illicit drugs  Current medications and supplements including opioid prescriptions.  Functional ability and status Nutritional status Physical activity Advanced directives List of other physicians Hospitalizations, surgeries, and ER visits in previous 12 months Vitals Screenings to include cognitive, depression, and falls Referrals and appointments  In addition, I have reviewed and discussed with patient certain preventive protocols, quality metrics, and best practice recommendations. A written personalized care plan for preventive services as well as general preventive health recommendations were provided to patient.     Kathryne Parisian, RMA 11/04/2023

## 2023-11-04 NOTE — Telephone Encounter (Signed)
 Sent email back with BMI

## 2023-11-08 ENCOUNTER — Ambulatory Visit (INDEPENDENT_AMBULATORY_CARE_PROVIDER_SITE_OTHER): Payer: Self-pay | Admitting: Primary Care

## 2023-11-17 ENCOUNTER — Encounter: Payer: Self-pay | Admitting: Nurse Practitioner

## 2023-11-18 ENCOUNTER — Other Ambulatory Visit: Payer: Self-pay

## 2023-11-18 ENCOUNTER — Encounter: Payer: Self-pay | Admitting: Podiatry

## 2023-11-18 ENCOUNTER — Ambulatory Visit: Admitting: Podiatry

## 2023-11-18 DIAGNOSIS — B351 Tinea unguium: Secondary | ICD-10-CM

## 2023-11-18 DIAGNOSIS — M79672 Pain in left foot: Secondary | ICD-10-CM | POA: Diagnosis not present

## 2023-11-18 DIAGNOSIS — L600 Ingrowing nail: Secondary | ICD-10-CM | POA: Diagnosis not present

## 2023-11-18 DIAGNOSIS — M79671 Pain in right foot: Secondary | ICD-10-CM | POA: Diagnosis not present

## 2023-11-18 MED ORDER — TERBINAFINE HCL 250 MG PO TABS
250.0000 mg | ORAL_TABLET | Freq: Every day | ORAL | 0 refills | Status: DC
  Filled 2023-11-18: qty 30, 30d supply, fill #0

## 2023-11-18 NOTE — Addendum Note (Signed)
 Addended by: Reche Canales on: 11/18/2023 12:16 PM   Modules accepted: Orders

## 2023-11-18 NOTE — Progress Notes (Signed)
 Patient presents for evaluation and treatment of tenderness and some redness around nails feet.  Tenderness around toes with walking and wearing shoes.  He is type II diabetic.  Does not complain of any burning or numbness in the feet.  He has never had any history of infections or ulcers in the feet to the hallux nails especially have been bothering him both at the nail borders digging into the sides of his nail folds.  Said he soaks them occasional which seems to help with the discomfort  Physical exam:  General appearance: Alert, pleasant, and in no acute distress.  Vascular: Pedal pulses: DP 1/4 B/L, PT 0/4 B/L. mild edema lower legs bilaterally.  Capillary refill time immediate  Neurological:  Light touch intact bilaterally .vibratory sensation intact bilaterally .monofilament sensation intact bilaterally  Dermatologic:  Nails thickened, disfigured, discolored 1-5 BL with subungual debris.  Redness and hypertrophic nail folds along nail folds bilaterally but no signs of drainage or infection.  Skin somewhat thin and atrophic with no hair growth on the lower extremity and areas of hyperpigmentation of skin of the lower extremity bilaterally  Musculoskeletal:  Mild hammertoe deformities 2 through 5 bilaterally.  Normal muscle strength in foot and ankle bilaterally.  Normal ankle and subtalar joint range of motion   Diagnosis: 1. Painful onychomycotic nails 1 through 5 bilaterally. 2. Pain toes 1 through 5 bilaterally.  Plan: Debrided onychomycotic nails 1 through 5 bilaterally.  Return 3 months

## 2023-11-26 ENCOUNTER — Other Ambulatory Visit: Payer: Self-pay

## 2023-11-27 ENCOUNTER — Other Ambulatory Visit: Payer: Self-pay

## 2023-12-01 ENCOUNTER — Encounter: Payer: Self-pay | Admitting: Primary Care

## 2023-12-09 ENCOUNTER — Other Ambulatory Visit (INDEPENDENT_AMBULATORY_CARE_PROVIDER_SITE_OTHER): Payer: Self-pay | Admitting: Primary Care

## 2023-12-09 DIAGNOSIS — E785 Hyperlipidemia, unspecified: Secondary | ICD-10-CM

## 2023-12-09 DIAGNOSIS — Z760 Encounter for issue of repeat prescription: Secondary | ICD-10-CM

## 2023-12-11 ENCOUNTER — Other Ambulatory Visit: Payer: Self-pay

## 2023-12-11 MED ORDER — ATORVASTATIN CALCIUM 10 MG PO TABS
10.0000 mg | ORAL_TABLET | Freq: Every day | ORAL | 3 refills | Status: AC
  Filled 2023-12-11: qty 90, 90d supply, fill #0
  Filled 2024-03-25: qty 90, 90d supply, fill #1

## 2023-12-11 NOTE — Telephone Encounter (Signed)
 Requested Prescriptions  Pending Prescriptions Disp Refills   atorvastatin  (LIPITOR) 10 MG tablet 90 tablet 3    Sig: Take 1 tablet (10 mg total) by mouth daily.     Cardiovascular:  Antilipid - Statins Failed - 12/11/2023 10:12 AM      Failed - Lipid Panel in normal range within the last 12 months    Cholesterol, Total  Date Value Ref Range Status  10/21/2023 166 100 - 199 mg/dL Final   LDL Chol Calc (NIH)  Date Value Ref Range Status  10/21/2023 81 0 - 99 mg/dL Final   HDL  Date Value Ref Range Status  10/21/2023 74 >39 mg/dL Final   Triglycerides  Date Value Ref Range Status  10/21/2023 53 0 - 149 mg/dL Final         Passed - Patient is not pregnant      Passed - Valid encounter within last 12 months    Recent Outpatient Visits           1 month ago Encounter for [employer] annual wellness exam   [place] [facility] Celestia Rosaline SQUIBB, NP   1 month ago Encounter for immunization   [place] [facility] Celestia Rosaline SQUIBB, NP   7 months ago Type 2 diabetes mellitus without complication, without long-term current use of insulin  (HCC)   [place] [facility] Celestia Rosaline SQUIBB, NP   10 months ago Type 2 diabetes mellitus without complication, without long-term current use of insulin  (HCC)   [place] [facility] Celestia Rosaline SQUIBB, NP   1 year ago Type 2 diabetes mellitus without complication, without long-term current use of insulin  [facility])    [facility] Celestia Rosaline SQUIBB, NP

## 2023-12-14 ENCOUNTER — Other Ambulatory Visit: Payer: Self-pay

## 2023-12-14 ENCOUNTER — Ambulatory Visit: Admitting: Gastroenterology

## 2023-12-14 ENCOUNTER — Encounter: Payer: Self-pay | Admitting: Gastroenterology

## 2023-12-14 VITALS — BP 90/50 | HR 90 | Ht 73.0 in | Wt 166.0 lb

## 2023-12-14 DIAGNOSIS — R110 Nausea: Secondary | ICD-10-CM | POA: Diagnosis not present

## 2023-12-14 DIAGNOSIS — R630 Anorexia: Secondary | ICD-10-CM

## 2023-12-14 DIAGNOSIS — R933 Abnormal findings on diagnostic imaging of other parts of digestive tract: Secondary | ICD-10-CM | POA: Diagnosis not present

## 2023-12-14 DIAGNOSIS — K219 Gastro-esophageal reflux disease without esophagitis: Secondary | ICD-10-CM | POA: Diagnosis not present

## 2023-12-14 DIAGNOSIS — Z1211 Encounter for screening for malignant neoplasm of colon: Secondary | ICD-10-CM

## 2023-12-14 MED ORDER — PANTOPRAZOLE SODIUM 40 MG PO TBEC
40.0000 mg | DELAYED_RELEASE_TABLET | Freq: Every day | ORAL | 2 refills | Status: DC
  Filled 2023-12-14: qty 90, 90d supply, fill #0

## 2023-12-14 MED ORDER — NA SULFATE-K SULFATE-MG SULF 17.5-3.13-1.6 GM/177ML PO SOLN
1.0000 | Freq: Once | ORAL | 0 refills | Status: AC
  Filled 2023-12-14: qty 354, 1d supply, fill #0

## 2023-12-14 NOTE — Progress Notes (Signed)
 Chief Complaint: Other acute gastritis without hemorrhage  Primary GI Doctor: Dr. San  HPI: Patient is a  63  year old A.A. male patient with past medical history of DM and hyperlipidemia, who was referred to me by Haze Servant, NP on 09/15/23 for a complaint of Other acute gastritis without hemorrhage  .    08/27/2023 ED visit at [facility] for complaint of epigastric abdominal pain with nausea and nonbloody nonbilious emesis. Workup was notable for CT scan with gastric wall thickening and edema consistent with gastritis versus peptic ulcer disease.  Labs show lipase 26, BUN 6, creatinine 0.83, normal LFTs, WBC 6.1, hemoglobin 13.3, platelets 245. No leukocytosis or anemia noted on CBC.  Metabolic panel without significant electrolyte derangements or renal sufficiency.  Mild hyperglycemia without DKA.  No evidence of bili obstruction or pancreatitis. Patient treated with oral Carafate  which provided relief.  Interval History    Patient presents for evaluation after recent ED visit. Patient reports he was taking pantoprazole  20 mg po daily and Carafate  1GM three times daily.  Patient reports the nausea and epigastric pain has improved. He has continued with intermittent episodes where food comes back up. He reports he has poor appetite. Patient has lost 6lbs over the last 2 mths. He tells me he thinks its due to stress at home, not due to symptoms he experienced.  He has 2-3 bowel movement daily. No blood in stool.  Patient smokes marajuana daily.   Socially drinks.   Patient uses OTC Goody powder and Tylenol , very seldom.  Patient never had EGD or colonoscopy.   Patient's family history includes father with lung CA   Wt Readings from Last 3 Encounters:  12/14/23 166 lb (75.3 kg)  11/04/23 172 lb 6.4 oz (78.2 kg)  10/21/23 172 lb 6.4 oz (78.2 kg)    Past Medical History:  Diagnosis Date   Diabetes mellitus without complication (HCC)    No pertinent past medical history      Past Surgical History:  Procedure Laterality Date   HERNIA REPAIR     NO PAST SURGERIES      Current Outpatient Medications  Medication Sig Dispense Refill   atorvastatin  (LIPITOR) 10 MG tablet Take 1 tablet (10 mg total) by mouth daily. 90 tablet 3   Colloidal Oatmeal (EUCERIN ECZEMA RELIEF) 1 % CREA Apply 156 g topically 2 (two) times daily as needed. 156 g 2   glucose monitoring kit (FREESTYLE) monitoring kit 1 each by Does not apply route as needed for other. Please include lancet and test strip . Testing once daily 1 each 0   hydrOXYzine  (ATARAX ) 10 MG tablet Take 1 tablet (10 mg total) by mouth every 8 (eight) hours as needed. 90 tablet 1   lisinopril -hydrochlorothiazide  (ZESTORETIC ) 20-25 MG tablet Take 1 tablet by mouth daily. 90 tablet 1   metFORMIN  (GLUCOPHAGE ) 1000 MG tablet Take 1 tablet (1,000 mg total) by mouth 2 (two) times daily with a meal. 180 tablet 1   sildenafil  (VIAGRA ) 100 MG tablet Take 0.5-1 tablets (50-100 mg total) by mouth daily as needed for erectile dysfunction. 10 tablet 6   sucralfate  (CARAFATE ) 1 GM/10ML suspension Take 10 mLs (1 g total) by mouth 4 (four) times daily -  with meals and at bedtime. 420 mL 0   terbinafine  (LAMISIL ) 250 MG tablet Take 1 tablet (250 mg total) by mouth daily. 30 tablet 0   No current facility-administered medications for this visit.    Allergies as of 12/14/2023 -  Review Complete 12/14/2023  Allergen Reaction Noted   Zosyn  [piperacillin  sod-tazobactam so] Rash 02/26/2012    Family History  Problem Relation Age of Onset   Diabetes Mother    Cancer Father     Review of Systems:    Constitutional: No weight loss, fever, chills, weakness or fatigue HEENT: Eyes: No change in vision               Ears, Nose, Throat:  No change in hearing or congestion Skin: No rash or itching Cardiovascular: No chest pain, chest pressure or palpitations   Respiratory: No SOB or cough Gastrointestinal: See HPI and otherwise  negative Genitourinary: No dysuria or change in urinary frequency Neurological: No headache, dizziness or syncope Musculoskeletal: No new muscle or joint pain Hematologic: No bleeding or bruising Psychiatric: No history of depression or anxiety    Physical Exam:  Vital signs: BP (!) 90/50   Pulse 90   Ht 6' 1 (1.854 m)   Wt 166 lb (75.3 kg)   BMI 21.90 kg/m   Constitutional:   Pleasant A.A. male appears to be in NAD, Well developed, Well nourished, alert and cooperative Throat: Oral cavity and pharynx without inflammation, swelling or lesion.  Respiratory: Respirations even and unlabored. Lungs clear to auscultation bilaterally.   No wheezes, crackles, or rhonchi.  Cardiovascular: Normal S1, S2. Regular rate and rhythm. No peripheral edema, cyanosis or pallor.  Gastrointestinal:  Soft, nondistended, nontender. No rebound or guarding. Normal bowel sounds. No appreciable masses or hepatomegaly. Rectal:  Not performed.  Msk:  Symmetrical without gross deformities. Without edema, no deformity or joint abnormality.  Neurologic:  Alert and  oriented x4;  grossly normal neurologically.  Skin:   Dry and intact without significant lesions or rashes. Psychiatric: Oriented to person, place and time. Demonstrates good judgement and reason without abnormal affect or behaviors.  RELEVANT LABS AND IMAGING: CBC    Latest Ref Rng & Units 10/21/2023   10:14 AM 08/27/2023    7:18 PM 01/21/2023    9:42 AM  CBC  WBC 3.4 - 10.8 x10E3/uL 5.5  6.1  4.0   Hemoglobin 13.0 - 17.7 g/dL 87.3  86.6  87.0   Hematocrit 37.5 - 51.0 % 40.4  39.0  38.0   Platelets 150 - 450 x10E3/uL 248  245  249      CMP     Latest Ref Rng & Units 10/21/2023   10:14 AM 08/27/2023    7:18 PM 01/21/2023    9:42 AM  CMP  Glucose 70 - 99 mg/dL 88  868  899   BUN 8 - 27 mg/dL 10  6  13    Creatinine 0.76 - 1.27 mg/dL 9.19  9.16  9.19   Sodium 134 - 144 mmol/L 141  135  139   Potassium 3.5 - 5.2 mmol/L 3.7  3.8  4.6    Chloride 96 - 106 mmol/L 102  101  104   CO2 20 - 29 mmol/L 24  24  22    Calcium  8.6 - 10.2 mg/dL 9.5  9.8  9.5   Total Protein 6.0 - 8.5 g/dL 6.8  7.2  6.9   Total Bilirubin 0.0 - 1.2 mg/dL 0.6  1.2  0.3   Alkaline Phos 44 - 121 IU/L 79  57  75   AST 0 - 40 IU/L 20  26  24    ALT 0 - 44 IU/L 13  16  15       Lab Results  Component  Value Date   TSH 2.310 04/13/2017  08/27/23 CTAP-   IMPRESSION: 1. Gastric wall thickening and edema in the pyloric region likely representing peptic ulcer disease or gastritis. No evidence of bowel obstruction. 2. Mild fatty infiltration of the liver. Pain 3 aortic atherosclerosis. Assessment: Encounter Diagnoses  Name Primary?   Abnormal CT scan, gastrointestinal tract Yes   Special screening for malignant neoplasms, colon    Gastroesophageal reflux disease, unspecified whether esophagitis present    Nausea without vomiting    Poor appetite      63 year old A. A male patient who presents for follow-up after recent ED visit for epigastric pain, nausea and vomiting. CTAP showed Gastric wall thickening and edema in the pyloric region likely representing peptic ulcer disease or gastritis. Patient placed on PPI therapy and sucralfate  which helped some. He continues with regurgitation and poor appetite. Will proceed with upper GI endoscopy to evaluate for PUD, esophagitis, and/ Barrett's.    Patient also due for colon screening colonoscopy, will go ahead and schedule colonoscopy in [facility] with Dr. Jerie.   Plan: - Continue pantoprazole  40 mg po daily  -No NSAIDs -Marajuana cessation -scheduled EGD in [facility] with Dr. San. The risks and benefits of EGD with possible biopsies and esophageal dilation were discussed with the patient who agrees to proceed. -Schedule for a colonoscopy in [facility] with Dr. San . The risks and benefits of colonoscopy with possible polypectomy / biopsies were discussed and the patient agrees to proceed.    Thank you for the  courtesy of this consult. Please call me with any questions or concerns.   Deamber Buckhalter, FNP-C [facility] 12/14/2023, 1:52 PM  Cc: Celestia Rosaline SQUIBB, NP

## 2023-12-14 NOTE — Patient Instructions (Addendum)
 Recommend GERD diet Continue pantoprazole  40mg  po daily 1 tablet 30-45 minutes before breakfast  No NSAIDs (goody powders)  Recommend high fiber diet  We have sent the following medications to your pharmacy for you to pick up at your convenience: SUPREP  You have been scheduled for an endoscopy and colonoscopy. Please follow the written instructions given to you at your visit today.  If you use inhalers (even only as needed), please bring them with you on the day of your procedure.  DO NOT TAKE 7 DAYS PRIOR TO TEST- Trulicity (dulaglutide) Ozempic, Wegovy (semaglutide) Mounjaro (tirzepatide) Bydureon Bcise (exanatide extended release)  DO NOT TAKE 1 DAY PRIOR TO YOUR TEST Rybelsus (semaglutide) Adlyxin (lixisenatide) Victoza (liraglutide) Byetta (exanatide) ___________________________________________________________________________  Due to recent changes in healthcare laws, you may see the results of your imaging and laboratory studies on MyChart before your provider has had a chance to review them.  We understand that in some cases there may be results that are confusing or concerning to you. Not all laboratory results come back in the same time frame and the provider may be waiting for multiple results in order to interpret others.  Please give us  48 hours in order for your provider to thoroughly review all the results before contacting the office for clarification of your results.   _______________________________________________________  If your blood pressure at your visit was 140/90 or greater, please contact your primary care physician to follow up on this.  _______________________________________________________  If you are age 76 or older, your body mass index should be between 23-30. Your Body mass index is 21.9 kg/m. If this is out of the aforementioned range listed, please consider follow up with your Primary Care Provider.  If you are age 18 or younger, your body mass  index should be between 19-25. Your Body mass index is 21.9 kg/m. If this is out of the aformentioned range listed, please consider follow up with your Primary Care Provider.   ________________________________________________________  The [facility] providers would like to encourage you to use MYCHART to communicate with providers for non-urgent requests or questions.  Due to long hold times on the telephone, sending your provider a message by [facility] may be a faster and more efficient way to get a response.  Please allow 48 business hours for a response.  Please remember that this is for non-urgent requests.  _______________________________________________________  Thank you for trusting me with your gastrointestinal care. Deanna May, RNP

## 2023-12-16 NOTE — Progress Notes (Signed)
 Agree with the assessment and plan as outlined by [facility], FNP-C.  Carlitos Bottino, DO, [facility]

## 2023-12-28 ENCOUNTER — Other Ambulatory Visit: Payer: Self-pay | Admitting: Podiatry

## 2023-12-31 ENCOUNTER — Other Ambulatory Visit: Payer: Self-pay

## 2024-01-18 ENCOUNTER — Encounter: Payer: Self-pay | Admitting: Gastroenterology

## 2024-01-20 ENCOUNTER — Other Ambulatory Visit: Payer: Self-pay

## 2024-01-26 ENCOUNTER — Encounter: Payer: Self-pay | Admitting: Gastroenterology

## 2024-01-26 ENCOUNTER — Ambulatory Visit: Admitting: Gastroenterology

## 2024-01-26 VITALS — BP 166/92 | HR 50 | Temp 97.2°F | Resp 12 | Ht 73.0 in | Wt 166.0 lb

## 2024-01-26 DIAGNOSIS — K31A11 Gastric intestinal metaplasia without dysplasia, involving the antrum: Secondary | ICD-10-CM

## 2024-01-26 DIAGNOSIS — K2950 Unspecified chronic gastritis without bleeding: Secondary | ICD-10-CM

## 2024-01-26 DIAGNOSIS — K641 Second degree hemorrhoids: Secondary | ICD-10-CM | POA: Diagnosis not present

## 2024-01-26 DIAGNOSIS — R110 Nausea: Secondary | ICD-10-CM

## 2024-01-26 DIAGNOSIS — R1013 Epigastric pain: Secondary | ICD-10-CM

## 2024-01-26 DIAGNOSIS — D127 Benign neoplasm of rectosigmoid junction: Secondary | ICD-10-CM | POA: Diagnosis not present

## 2024-01-26 DIAGNOSIS — D125 Benign neoplasm of sigmoid colon: Secondary | ICD-10-CM | POA: Diagnosis not present

## 2024-01-26 DIAGNOSIS — D124 Benign neoplasm of descending colon: Secondary | ICD-10-CM

## 2024-01-26 DIAGNOSIS — K219 Gastro-esophageal reflux disease without esophagitis: Secondary | ICD-10-CM | POA: Diagnosis not present

## 2024-01-26 DIAGNOSIS — Z1211 Encounter for screening for malignant neoplasm of colon: Secondary | ICD-10-CM

## 2024-01-26 DIAGNOSIS — R630 Anorexia: Secondary | ICD-10-CM

## 2024-01-26 DIAGNOSIS — R933 Abnormal findings on diagnostic imaging of other parts of digestive tract: Secondary | ICD-10-CM

## 2024-01-26 MED ORDER — SODIUM CHLORIDE 0.9 % IV SOLN: 500.0000 mL | Freq: Once | INTRAVENOUS | Status: DC

## 2024-01-26 NOTE — Op Note (Signed)
 [facility] Patient Name: Frank Landry Procedure Date: 01/26/2024 1:33 PM MRN: [id number] Endoscopist: Sandor Flatter , MD, [id number] Age: 63 Referring MD:  Date of Birth: 1960/07/13 Gender: Male Account #: [id number] Procedure:                Colonoscopy Indications:              Screening for colorectal malignant neoplasm, This                            is the patient's first colonoscopy Medicines:                Monitored Anesthesia Care Procedure:                Pre-Anesthesia Assessment:                           - Prior to the procedure, a History and Physical                            was performed, and patient medications and                            allergies were reviewed. The patient's tolerance of                            previous anesthesia was also reviewed. The risks                            and benefits of the procedure and the sedation                            options and risks were discussed with the patient.                            All questions were answered, and informed consent                            was obtained. Prior Anticoagulants: The patient has                            taken no anticoagulant or antiplatelet agents. ASA                            Grade Assessment: III - A patient with severe                            systemic disease. After reviewing the risks and                            benefits, the patient was deemed in satisfactory                            condition to undergo the procedure.  After obtaining informed consent, the colonoscope                            was passed under direct vision. Throughout the                            procedure, the patient's blood pressure, pulse, and                            oxygen saturations were monitored continuously. The                            Olympus Scope [id number] was introduced through the                            anus and  advanced to the the cecum, identified by                            appendiceal orifice and ileocecal valve. The                            colonoscopy was performed without difficulty. The                            patient tolerated the procedure well. The quality                            of the bowel preparation was good. The ileocecal                            valve, appendiceal orifice, and rectum were                            photographed. Scope In: 1:46:26 PM Scope Out: 2:03:09 PM Scope Withdrawal Time: 0 hours 14 minutes 32 seconds  Total Procedure Duration: 0 hours 16 minutes 43 seconds  Findings:                 The perianal and digital rectal examinations were                            normal.                           Four sessile polyps were found in the sigmoid colon                            (2) and descending colon (2). The polyps were 2 to                            4 mm in size. These polyps were removed with a cold                            snare. Resection and retrieval were complete.  Estimated blood loss was minimal.                           A 5 mm polyp was found in the recto-sigmoid colon.                            The polyp was sessile. The polyp was removed with a                            cold snare. Resection and retrieval were complete.                            Estimated blood loss was minimal.                           Non-bleeding internal hemorrhoids were found during                            retroflexion. The hemorrhoids were small and Grade                            II (internal hemorrhoids that prolapse but reduce                            spontaneously). Complications:            No immediate complications. Estimated Blood Loss:     Estimated blood loss was minimal. Impression:               - Four 2 to 4 mm polyps in the sigmoid colon and in                            the descending colon, removed with a cold  snare.                            Resected and retrieved.                           - One 5 mm polyp at the recto-sigmoid colon,                            removed with a cold snare. Resected and retrieved.                           - Non-bleeding internal hemorrhoids. Recommendation:           - Patient has a contact number available for                            emergencies. The signs and symptoms of potential                            delayed complications were discussed with the  patient. Return to normal activities tomorrow.                            Written discharge instructions were provided to the                            patient.                           - Resume previous diet.                           - Continue present medications.                           - Await pathology results.                           - Repeat colonoscopy for surveillance based on                            pathology results.                           - Return to GI office PRN. Sandor Flatter, MD 01/26/2024 2:10:42 PM

## 2024-01-26 NOTE — Patient Instructions (Signed)
 Discharge instructions given. Handouts on polyps,Hemorrhoids. Resume previous medications. YOU HAD AN ENDOSCOPIC PROCEDURE TODAY AT THE [facility]:   Refer to the procedure report that was given to you for any specific questions about what was found during the examination.  If the procedure report does not answer your questions, please call your gastroenterologist to clarify.  If you requested that your care partner not be given the details of your procedure findings, then the procedure report has been included in a sealed envelope for you to review at your convenience later.  YOU SHOULD EXPECT: Some feelings of bloating in the abdomen. Passage of more gas than usual.  Walking can help get rid of the air that was put into your GI tract during the procedure and reduce the bloating. If you had a lower endoscopy (such as a colonoscopy or flexible sigmoidoscopy) you may notice spotting of blood in your stool or on the toilet paper. If you underwent a bowel prep for your procedure, you may not have a normal bowel movement for a few days.  Please Note:  You might notice some irritation and congestion in your nose or some drainage.  This is from the oxygen used during your procedure.  There is no need for concern and it should clear up in a day or so.  SYMPTOMS TO REPORT IMMEDIATELY:  Following lower endoscopy (colonoscopy or flexible sigmoidoscopy):  Excessive amounts of blood in the stool  Significant tenderness or worsening of abdominal pains  Swelling of the abdomen that is new, acute  Fever of 100F or higher  Following upper endoscopy (EGD)  Vomiting of blood or coffee ground material  New chest pain or pain under the shoulder blades  Painful or persistently difficult swallowing  New shortness of breath  Fever of 100F or higher  Black, tarry-looking stools  For urgent or emergent issues, a gastroenterologist can be reached at any hour by calling [phone]. Do not use  MyChart messaging for urgent concerns.    DIET:  We do recommend a small meal at first, but then you may proceed to your regular diet.  Drink plenty of fluids but you should avoid alcoholic beverages for 24 hours.  ACTIVITY:  You should plan to take it easy for the rest of today and you should NOT DRIVE or use heavy machinery until tomorrow (because of the sedation medicines used during the test).    FOLLOW UP: Our staff will call the number listed on your records the next business day following your procedure.  We will call around 7:15- 8:00 am to check on you and address any questions or concerns that you may have regarding the information given to you following your procedure. If we do not reach you, we will leave a message.     If any biopsies were taken you will be contacted by phone or by letter within the next 1-3 weeks.  Please call us  at [phone] if you have not heard about the biopsies in 3 weeks.    SIGNATURES/CONFIDENTIALITY: You and/or your care partner have signed paperwork which will be entered into your electronic medical record.  These signatures attest to the fact that that the information above on your After Visit Summary has been reviewed and is understood.  Full responsibility of the confidentiality of this discharge information lies with you and/or your care-partner.

## 2024-01-26 NOTE — Op Note (Signed)
 [facility] Patient Name: Frank Landry Procedure Date: 01/26/2024 1:33 PM MRN: [id number] Endoscopist: Sandor Flatter , MD, [id number] Age: 63 Referring MD:  Date of Birth: 1961-02-16 Gender: Male Account #: [id number] Procedure:                Upper GI endoscopy Indications:              Epigastric abdominal pain, Abnormal CT of the GI                            tract, Nausea with vomiting Medicines:                Monitored Anesthesia Care Procedure:                Pre-Anesthesia Assessment:                           - Prior to the procedure, a History and Physical                            was performed, and patient medications and                            allergies were reviewed. The patient's tolerance of                            previous anesthesia was also reviewed. The risks                            and benefits of the procedure and the sedation                            options and risks were discussed with the patient.                            All questions were answered, and informed consent                            was obtained. Prior Anticoagulants: The patient has                            taken no anticoagulant or antiplatelet agents. ASA                            Grade Assessment: III - A patient with severe                            systemic disease. After reviewing the risks and                            benefits, the patient was deemed in satisfactory                            condition to undergo the procedure.  After obtaining informed consent, the endoscope was                            passed under direct vision. Throughout the                            procedure, the patient's blood pressure, pulse, and                            oxygen saturations were monitored continuously. The                            GIF [id number] #[id number] was introduced through the                            mouth, and advanced to  the second part of duodenum.                            The upper GI endoscopy was accomplished without                            difficulty. The patient tolerated the procedure                            well. Scope In: Scope Out: Findings:                 The examined esophagus was normal.                           The entire examined stomach was normal. Biopsies                            were taken with a cold forceps for Helicobacter                            pylori testing. Estimated blood loss was minimal.                           The examined duodenum was normal. Complications:            No immediate complications. Estimated Blood Loss:     Estimated blood loss was minimal. Impression:               - Normal esophagus.                           - Normal stomach. Biopsied.                           - Normal examined duodenum. Recommendation:           - Patient has a contact number available for                            emergencies. The signs and symptoms of potential  delayed complications were discussed with the                            patient. Return to normal activities tomorrow.                            Written discharge instructions were provided to the                            patient.                           - Resume previous diet.                           - Continue present medications.                           - Await pathology results.                           - Return to GI clinic PRN. Sandor Flatter, MD 01/26/2024 2:07:49 PM

## 2024-01-26 NOTE — Progress Notes (Signed)
 Vss nad trans to pacu

## 2024-01-26 NOTE — Progress Notes (Signed)
 Called to room to assist during endoscopic procedure.  Patient ID and intended procedure confirmed with present staff. Received instructions for my participation in the procedure from the performing physician.

## 2024-01-26 NOTE — Progress Notes (Signed)
 GASTROENTEROLOGY PROCEDURE H&P NOTE   Primary Care Physician: Celestia Rosaline SQUIBB, NP    Reason for Procedure:  Epigastric pain, nausea/vomiting, abnormal CT, regurgitation, decreased appetite, colon cancer screening  Plan:    EGD, colonoscopy  Patient is appropriate for endoscopic procedure(s) in the ambulatory ([facility]) setting.  The nature of the procedure, as well as the risks, benefits, and alternatives were carefully and thoroughly reviewed with the patient. Ample time for discussion and questions allowed. The patient understood, was satisfied, and agreed to proceed.     HPI: Frank Landry is a 63 y.o. male who presents for EGD for evaluation of epigastric pain, nausea/vomiting, decreased appetite.  CT in 08/2023 with gastric wall thickening and edema in the pyloric region.  He was treated with high-dose PPI and Carafate .  No previous EGD.  Separately, due for colonoscopy for routine CRC screening.  No previous colonoscopy.  Past Medical History:  Diagnosis Date   Diabetes mellitus without complication (HCC)    No pertinent past medical history     Past Surgical History:  Procedure Laterality Date   HERNIA REPAIR     NO PAST SURGERIES      Prior to Admission medications   Medication Sig Start Date End Date Taking? Authorizing Provider  atorvastatin  (LIPITOR) 10 MG tablet Take 1 tablet (10 mg total) by mouth daily. 12/11/23  Yes Celestia Rosaline SQUIBB, NP  Colloidal Oatmeal (EUCERIN ECZEMA RELIEF) 1 % CREA Apply 156 g topically 2 (two) times daily as needed. 12/19/19  Yes Celestia Rosaline SQUIBB, NP  glucose monitoring kit (FREESTYLE) monitoring kit 1 each by Does not apply route as needed for other. Please include lancet and test strip . Testing once daily 03/16/19  Yes Celestia Rosaline SQUIBB, NP  lisinopril -hydrochlorothiazide  (ZESTORETIC ) 20-25 MG tablet Take 1 tablet by mouth daily. 10/26/23  Yes Celestia Rosaline SQUIBB, NP  metFORMIN  (GLUCOPHAGE ) 1000 MG tablet Take 1  tablet (1,000 mg total) by mouth 2 (two) times daily with a meal. 10/21/23  Yes Celestia Rosaline SQUIBB, NP  pantoprazole  (PROTONIX ) 40 MG tablet Take 1 tablet (40 mg total) by mouth daily. 12/14/23  Yes May, Deanna J, NP  sucralfate  (CARAFATE ) 1 GM/10ML suspension Take 10 mLs (1 g total) by mouth 4 (four) times daily -  with meals and at bedtime. 09/15/23  Yes Fleming, Zelda W, NP  hydrOXYzine  (ATARAX ) 10 MG tablet Take 1 tablet (10 mg total) by mouth every 8 (eight) hours as needed. 10/21/23   Celestia Rosaline SQUIBB, NP  sildenafil  (VIAGRA ) 100 MG tablet Take 0.5-1 tablets (50-100 mg total) by mouth daily as needed for erectile dysfunction. 10/21/23   Celestia Rosaline SQUIBB, NP  terbinafine  (LAMISIL ) 250 MG tablet Take 1 tablet (250 mg total) by mouth daily. 11/18/23   Christine Rush, DPM    Current Outpatient Medications  Medication Sig Dispense Refill   atorvastatin  (LIPITOR) 10 MG tablet Take 1 tablet (10 mg total) by mouth daily. 90 tablet 3   Colloidal Oatmeal (EUCERIN ECZEMA RELIEF) 1 % CREA Apply 156 g topically 2 (two) times daily as needed. 156 g 2   glucose monitoring kit (FREESTYLE) monitoring kit 1 each by Does not apply route as needed for other. Please include lancet and test strip . Testing once daily 1 each 0   lisinopril -hydrochlorothiazide  (ZESTORETIC ) 20-25 MG tablet Take 1 tablet by mouth daily. 90 tablet 1   metFORMIN  (GLUCOPHAGE ) 1000 MG tablet Take 1 tablet (1,000 mg total) by mouth 2 (two) times daily with a  meal. 180 tablet 1   pantoprazole  (PROTONIX ) 40 MG tablet Take 1 tablet (40 mg total) by mouth daily. 90 tablet 2   sucralfate  (CARAFATE ) 1 GM/10ML suspension Take 10 mLs (1 g total) by mouth 4 (four) times daily -  with meals and at bedtime. 420 mL 0   hydrOXYzine  (ATARAX ) 10 MG tablet Take 1 tablet (10 mg total) by mouth every 8 (eight) hours as needed. 90 tablet 1   sildenafil  (VIAGRA ) 100 MG tablet Take 0.5-1 tablets (50-100 mg total) by mouth daily as needed for erectile  dysfunction. 10 tablet 6   terbinafine  (LAMISIL ) 250 MG tablet Take 1 tablet (250 mg total) by mouth daily. 30 tablet 0   Current Facility-Administered Medications  Medication Dose Route Frequency Provider Last Rate Last Admin   0.9 %  sodium chloride  infusion  500 mL Intravenous Once Tahirah Sara V, DO        Allergies as of 01/26/2024 - Review Complete 01/26/2024  Allergen Reaction Noted   Zosyn  [piperacillin  sod-tazobactam so] Rash 02/26/2012    Family History  Problem Relation Age of Onset   Diabetes Mother    Cancer Father    Colon cancer Neg Hx    Esophageal cancer Neg Hx    Rectal cancer Neg Hx    Stomach cancer Neg Hx     Social History   Socioeconomic History   Marital status: Married    Spouse name: Not on file   Number of children: Not on file   Years of education: Not on file   Highest education level: Not on file  Occupational History   Not on file  Tobacco Use   Smoking status: Former    Current packs/day: 0.25    Average packs/day: 0.3 packs/day for 38.0 years (9.5 ttl pk-yrs)    Types: Cigarettes   Smokeless tobacco: Never   Tobacco comments:    quit 4 years ago   Vaping Use   Vaping status: Never Used  Substance and Sexual Activity   Alcohol use: No    Comment: occasionally   Drug use: No    Types: Marijuana    Comment: last used 3 blunts/1 gm 01/25/2024   Sexual activity: Not on file  Other Topics Concern   Not on file  Social History Narrative   Not on file   Social Drivers of Health   Financial Resource Strain: Patient Declined (04/22/2023)   Overall Financial Resource Strain (CARDIA)    Difficulty of Paying Living Expenses: Patient declined  Food Insecurity: No Food Insecurity (11/04/2023)   Hunger Vital Sign    Worried About Running Out of Food in the Last Year: Never true    Ran Out of Food in the Last Year: Never true  Transportation Needs: No Transportation Needs (11/04/2023)   PRAPARE - Administrator, Civil Service  (Medical): No    Lack of Transportation (Non-Medical): No  Physical Activity: Patient Declined (04/22/2023)   Exercise Vital Sign    Days of Exercise per Week: Patient declined    Minutes of Exercise per Session: Patient declined  Stress: No Stress Concern Present (04/22/2023)   [employer] of Occupational Health - Occupational Stress Questionnaire    Feeling of Stress : Not at all  Social Connections: Socially Integrated (04/22/2023)   Social Connection and Isolation Panel    Frequency of Communication with Friends and Family: More than three times a week    Frequency of Social Gatherings with Friends and Family: More  than three times a week    Attends Religious Services: 1 to 4 times per year    Active Member of Clubs or Organizations: Yes    Attends Banker Meetings: 1 to 4 times per year    Marital Status: Living with partner  Intimate Partner Violence: Not At Risk (11/04/2023)   Humiliation, Afraid, Rape, and Kick questionnaire    Fear of Current or Ex-Partner: No    Emotionally Abused: No    Physically Abused: No    Sexually Abused: No    Physical Exam: Vital signs in last 24 hours: @BP  121/68   Pulse 84   Temp (!) 97.2 F (36.2 C)   Ht 6' 1 (1.854 m)   Wt 166 lb (75.3 kg)   SpO2 96%   BMI 21.90 kg/m  GEN: NAD EYE: Sclerae anicteric ENT: MMM CV: Non-tachycardic Pulm: CTA b/l GI: Soft, NT/ND NEURO:  Alert & Oriented x 3   Sandor Flatter, DO [facility]   01/26/2024 1:26 PM

## 2024-01-27 ENCOUNTER — Telehealth: Payer: Self-pay | Admitting: *Deleted

## 2024-01-27 ENCOUNTER — Other Ambulatory Visit: Payer: Self-pay

## 2024-01-27 NOTE — Telephone Encounter (Signed)
  Follow up Call-     01/26/2024   12:52 PM  Call back number  Post procedure Call Back phone  # [phone]  Permission to leave phone message Yes     Patient questions:  Do you have a fever, pain , or abdominal swelling? No. Pain Score  0 *  Have you tolerated food without any problems? Yes.    Have you been able to return to your normal activities? Yes.    Do you have any questions about your discharge instructions: Diet   No. Medications  No. Follow up visit  No.  Do you have questions or concerns about your Care? No.  Actions: * If pain score is 4 or above: No action needed, pain <4.

## 2024-01-29 ENCOUNTER — Other Ambulatory Visit: Payer: Self-pay

## 2024-01-29 LAB — SURGICAL PATHOLOGY

## 2024-02-02 ENCOUNTER — Ambulatory Visit: Payer: Self-pay | Admitting: Gastroenterology

## 2024-02-05 ENCOUNTER — Telehealth: Payer: Self-pay | Admitting: Gastroenterology

## 2024-02-05 NOTE — Telephone Encounter (Signed)
 Inbound call from patient stating he is returning a call from yesterday in regards to his results Requesting a call back  Please advise  Thank you

## 2024-02-05 NOTE — Telephone Encounter (Signed)
 Attempted to reach patient by phone, but there was no answer and no voicemail was set up. Will continue efforts.

## 2024-02-09 ENCOUNTER — Other Ambulatory Visit: Payer: Self-pay

## 2024-02-09 ENCOUNTER — Other Ambulatory Visit (HOSPITAL_COMMUNITY): Payer: Self-pay

## 2024-02-09 DIAGNOSIS — B9681 Helicobacter pylori [H. pylori] as the cause of diseases classified elsewhere: Secondary | ICD-10-CM

## 2024-02-09 MED ORDER — PANTOPRAZOLE SODIUM 40 MG PO TBEC
DELAYED_RELEASE_TABLET | ORAL | 0 refills | Status: DC
  Filled 2024-02-09: qty 45, fill #0
  Filled 2024-03-25: qty 45, 31d supply, fill #0

## 2024-02-09 MED ORDER — BISMUTH SUBSALICYLATE 262 MG PO CHEW
524.0000 mg | CHEWABLE_TABLET | Freq: Four times a day (QID) | ORAL | 0 refills | Status: AC
  Filled 2024-02-09: qty 120, 15d supply, fill #0

## 2024-02-09 MED ORDER — METRONIDAZOLE 250 MG PO TABS
250.0000 mg | ORAL_TABLET | Freq: Four times a day (QID) | ORAL | 0 refills | Status: AC
  Filled 2024-02-09: qty 56, 14d supply, fill #0

## 2024-02-09 MED ORDER — DOXYCYCLINE HYCLATE 100 MG PO TABS
100.0000 mg | ORAL_TABLET | Freq: Two times a day (BID) | ORAL | 0 refills | Status: AC
  Filled 2024-02-09: qty 28, 14d supply, fill #0

## 2024-02-10 ENCOUNTER — Other Ambulatory Visit: Payer: Self-pay

## 2024-02-10 NOTE — Telephone Encounter (Signed)
 Patient informed of results per Dr San.  Patient advised to start quad therapy as instructed below:    1) increase pantoprazole  to 40 mg twice daily for 14 days, then can go back to 40 mg daily for continued reflux management  2) Pepto Bismol 2 tabs (262 mg each) 4 times a day x 14 days 3) Metronidazole  250 mg 4 times a day x 14 days 4) doxycycline  100 mg 2 times a day x 14 days   Patient advised that 4 weeks after treatment has been completed, he should check H. Pylori stool antigen via [employer] panel. Patient agreed to plan and verbalized understanding.  No further questions or concerns.

## 2024-02-12 ENCOUNTER — Other Ambulatory Visit: Payer: Self-pay

## 2024-02-17 ENCOUNTER — Encounter: Payer: Self-pay | Admitting: Podiatry

## 2024-02-17 ENCOUNTER — Ambulatory Visit (INDEPENDENT_AMBULATORY_CARE_PROVIDER_SITE_OTHER): Admitting: Podiatry

## 2024-02-17 DIAGNOSIS — B351 Tinea unguium: Secondary | ICD-10-CM | POA: Diagnosis not present

## 2024-02-17 DIAGNOSIS — M79671 Pain in right foot: Secondary | ICD-10-CM

## 2024-02-17 DIAGNOSIS — M79672 Pain in left foot: Secondary | ICD-10-CM

## 2024-02-17 NOTE — Progress Notes (Signed)
 Patient presents for evaluation and treatment of tenderness and some redness around nails feet.  Tenderness around toes with walking and wearing shoes.  Physical exam:  General appearance: Alert, pleasant, and in no acute distress.  Vascular: Pedal pulses: DP 2/4 B/L, PT 0/4 B/L. Mild edema lower legs bilaterally  Neu  Dermatologic:  Nails thickened, disfigured, discolored 1-5 BL with subungual debris.  Redness and hypertrophic nail folds along nail folds bilaterally but no signs of drainage or infection.  Musculoskeletal:     Diagnosis: 1. Painful onychomycotic nails 1 through 5 bilaterally. 2. Pain toes 1 through 5 bilaterally.  Plan: -Debrided onychomycotic nails 1 through 5 bilaterally.  Sharply debrided nails with nail clipper and reduced with a power bur.  Return 3 months [facility]

## 2024-02-19 ENCOUNTER — Other Ambulatory Visit: Payer: Self-pay

## 2024-03-25 ENCOUNTER — Other Ambulatory Visit: Payer: Self-pay

## 2024-03-28 ENCOUNTER — Other Ambulatory Visit: Payer: Self-pay

## 2024-04-13 ENCOUNTER — Other Ambulatory Visit (INDEPENDENT_AMBULATORY_CARE_PROVIDER_SITE_OTHER): Payer: Self-pay | Admitting: Primary Care

## 2024-04-13 DIAGNOSIS — E119 Type 2 diabetes mellitus without complications: Secondary | ICD-10-CM

## 2024-04-13 DIAGNOSIS — Z760 Encounter for issue of repeat prescription: Secondary | ICD-10-CM

## 2024-04-13 DIAGNOSIS — L299 Pruritus, unspecified: Secondary | ICD-10-CM

## 2024-04-13 NOTE — Telephone Encounter (Signed)
 Copied from CRM [id number]. Topic: Clinical - Medication Refill >> Apr 13, 2024  8:30 AM Aisha D wrote: Medication: metFORMIN  (GLUCOPHAGE ) 1000 MG tablet, hydrOXYzine  (ATARAX ) 10 MG tablet,   Has the patient contacted their pharmacy? Yes (Agent: If no, request that the patient contact the pharmacy for the refill. If patient does not wish to contact the pharmacy document the reason why and proceed with request.) (Agent: If yes, when and what did the pharmacy advise?)  This is the patient's preferred pharmacy:   [facility] [address], [address] [place] [address] Phone: [phone] Fax: [phone]   Is this the correct pharmacy for this prescription? Yes If no, delete pharmacy and type the correct one.   Has the prescription been filled recently? No  Is the patient out of the medication? Yes  Has the patient been seen for an appointment in the last year OR does the patient have an upcoming appointment? Yes  Can we respond through MyChart? Yes  Agent: Please be advised that Rx refills may take up to 3 business days. We ask that you follow-up with your pharmacy.

## 2024-04-14 ENCOUNTER — Other Ambulatory Visit (HOSPITAL_COMMUNITY): Payer: Self-pay

## 2024-04-14 MED ORDER — HYDROXYZINE HCL 10 MG PO TABS
10.0000 mg | ORAL_TABLET | Freq: Three times a day (TID) | ORAL | 2 refills | Status: DC | PRN
  Filled 2024-04-14: qty 90, 30d supply, fill #0

## 2024-04-14 MED ORDER — METFORMIN HCL 1000 MG PO TABS
1000.0000 mg | ORAL_TABLET | Freq: Two times a day (BID) | ORAL | 0 refills | Status: DC
  Filled 2024-04-14: qty 180, 90d supply, fill #0

## 2024-04-14 NOTE — Telephone Encounter (Signed)
 Requested Prescriptions  Pending Prescriptions Disp Refills   hydrOXYzine  (ATARAX ) 10 MG tablet 90 tablet 2    Sig: Take 1 tablet (10 mg total) by mouth every 8 (eight) hours as needed.     Ear, Nose, and Throat:  Antihistamines 2 Passed - 04/14/2024  5:29 PM      Passed - Cr in normal range and within 360 days    Creatinine, Ser  Date Value Ref Range Status  10/21/2023 0.80 0.76 - 1.27 mg/dL Final         Passed - Valid encounter within last 12 months    Recent Outpatient Visits           5 months ago Encounter for Medicare annual wellness exam   [place] [facility] Celestia Rosaline SQUIBB, NP   5 months ago Encounter for immunization   [place] [facility] Celestia Rosaline SQUIBB, NP   11 months ago Type 2 diabetes mellitus without complication, without long-term current use of insulin  (HCC)   [place] [facility] Celestia Rosaline SQUIBB, NP   1 year ago Type 2 diabetes mellitus without complication, without long-term current use of insulin  (HCC)    [facility] Celestia Rosaline SQUIBB, NP   1 year ago Type 2 diabetes mellitus without complication, without long-term current use of insulin  (HCC)    [facility] Celestia Rosaline SQUIBB, NP               metFORMIN  (GLUCOPHAGE ) 1000 MG tablet 180 tablet 0    Sig: Take 1 tablet (1,000 mg total) by mouth 2 (two) times daily with a meal.     Endocrinology:  Diabetes - Biguanides Failed - 04/14/2024  5:29 PM      Failed - B12 Level in normal range and within 720 days    No results found for: VITAMINB12       Passed - Cr in normal range and within 360 days    Creatinine, Ser  Date Value Ref Range Status  10/21/2023 0.80 0.76 - 1.27 mg/dL Final         Passed - HBA1C is between 0 and 7.9 and within 180 days    HbA1c, POC (controlled diabetic range)  Date Value Ref Range Status  04/22/2023 6.1 0.0 - 7.0 % Final   Hgb A1c MFr Bld   Date Value Ref Range Status  10/21/2023 6.1 (H) 4.8 - 5.6 % Final    Comment:             Prediabetes: 5.7 - 6.4          Diabetes: >6.4          Glycemic control for adults with diabetes: <7.0          Passed - eGFR in normal range and within 360 days    GFR calc Af Amer  Date Value Ref Range Status  12/19/2019 108 >59 mL/min/1.73 Final    Comment:    **[facility] currently reports eGFR in compliance with the current**   recommendations of the [employer]. [facility] will   update reporting as new guidelines are published from the NKF-ASN   Task force.    GFR, Estimated  Date Value Ref Range Status  08/27/2023 >60 >60 mL/min Final    Comment:    (NOTE) Calculated using the CKD-EPI Creatinine Equation (2021)    eGFR  Date Value Ref Range Status  10/21/2023 98 >59 mL/min/1.73 Final  Passed - Valid encounter within last 6 months    Recent Outpatient Visits           5 months ago Encounter for Medicare annual wellness exam   [place] [facility] Celestia Rosaline SQUIBB, NP   5 months ago Encounter for immunization   [place] [facility] Celestia Rosaline SQUIBB, NP   11 months ago Type 2 diabetes mellitus without complication, without long-term current use of insulin  (HCC)   [place] [facility] Celestia Rosaline SQUIBB, NP   1 year ago Type 2 diabetes mellitus without complication, without long-term current use of insulin  (HCC)   [place] [facility] Celestia Rosaline SQUIBB, NP   1 year ago Type 2 diabetes mellitus without complication, without long-term current use of insulin  (HCC)   [place] [facility] Celestia Rosaline SQUIBB, NP              Passed - CBC within normal limits and completed in the last 12 months    WBC  Date Value Ref Range Status  10/21/2023 5.5 3.4 - 10.8 x10E3/uL Final  08/27/2023 6.1 4.0 - 10.5 K/uL Final   RBC  Date Value Ref Range Status   10/21/2023 3.92 (L) 4.14 - 5.80 x10E6/uL Final  08/27/2023 4.08 (L) 4.22 - 5.81 MIL/uL Final   Hemoglobin  Date Value Ref Range Status  10/21/2023 12.6 (L) 13.0 - 17.7 g/dL Final   Hematocrit  Date Value Ref Range Status  10/21/2023 40.4 37.5 - 51.0 % Final   MCHC  Date Value Ref Range Status  10/21/2023 31.2 (L) 31.5 - 35.7 g/dL Final  96/72/7974 65.8 30.0 - 36.0 g/dL Final   [facility]  Date Value Ref Range Status  10/21/2023 32.1 26.6 - 33.0 pg Final  08/27/2023 32.6 26.0 - 34.0 pg Final   MCV  Date Value Ref Range Status  10/21/2023 103 (H) 79 - 97 fL Final   No results found for: PLTCOUNTKUC, LABPLAT, POCPLA RDW  Date Value Ref Range Status  10/21/2023 13.1 11.6 - 15.4 % Final

## 2024-04-19 ENCOUNTER — Other Ambulatory Visit: Payer: Self-pay

## 2024-04-20 ENCOUNTER — Other Ambulatory Visit (HOSPITAL_COMMUNITY): Payer: Self-pay

## 2024-05-11 ENCOUNTER — Ambulatory Visit (INDEPENDENT_AMBULATORY_CARE_PROVIDER_SITE_OTHER): Admitting: Primary Care

## 2024-05-13 ENCOUNTER — Telehealth (INDEPENDENT_AMBULATORY_CARE_PROVIDER_SITE_OTHER): Payer: Self-pay | Admitting: Primary Care

## 2024-05-13 NOTE — Telephone Encounter (Unsigned)
 Copied from CRM #[id number]. Topic: Medical Record Request - Records Request >> May 12, 2024  3:48 PM Ivette P wrote: Reason for CRM: Melissa called in to verify the chronic condition of the pt and would need for provider or nurse to follow up.   Reference - [id number]  Fax was sent 04/26/24 [employer] (Payer) [phone]

## 2024-05-16 NOTE — Telephone Encounter (Signed)
 Copied from CRM #[id number]. Topic: General - Other >> May 16, 2024  1:48 PM Berneda FALCON wrote: Reason for CRM: Doc from [employer] is calling to verify that patient has a chronic condition that would help him qualify for a chronic condition plan. She requested to speak to someone over the phone to confirm a chronic condition. Called [facility] and did not get an answer.  Callback number [phone]

## 2024-05-18 ENCOUNTER — Ambulatory Visit: Admitting: Podiatry

## 2024-05-18 ENCOUNTER — Encounter: Payer: Self-pay | Admitting: Podiatry

## 2024-05-18 DIAGNOSIS — M79671 Pain in right foot: Secondary | ICD-10-CM

## 2024-05-18 DIAGNOSIS — M79672 Pain in left foot: Secondary | ICD-10-CM | POA: Diagnosis not present

## 2024-05-18 DIAGNOSIS — B351 Tinea unguium: Secondary | ICD-10-CM

## 2024-05-18 NOTE — Progress Notes (Signed)
 Patient presents for evaluation and treatment of tenderness and some redness around nails feet.  Tenderness around toes with walking and wearing shoes.  Physical exam:  General appearance: Alert, pleasant, and in no acute distress.  Vascular: Pedal pulses: DP 2/4 B/L, PT 0/4 B/L. Mild edema lower legs bilaterally.  Capillary refill time immediate bilaterally  Neurologic:  Dermatologic:  Nails thickened, disfigured, discolored 1-5 BL with subungual debris.  Redness and hypertrophic nail folds along nail folds bilaterally but no signs of drainage or infection.  Musculoskeletal:     Diagnosis: 1. Painful onychomycotic nails 1 through 5 bilaterally. 2. Pain toes 1 through 5 bilaterally.  Plan: -Debrided onychomycotic nails 1 through 5 bilaterally.  Sharply debrided nails with nail clipper and reduced with a power bur.  Return 3 months [facility]

## 2024-05-20 NOTE — Telephone Encounter (Signed)
 Reached out to [place] and spoke with Hezzie and did the C-SNP Verification. He will fax confirmation

## 2024-06-20 ENCOUNTER — Telehealth: Payer: Self-pay | Admitting: Primary Care

## 2024-06-20 NOTE — Telephone Encounter (Signed)
 06/20/24 Called patient to confirm appointment but VM not setup

## 2024-06-22 ENCOUNTER — Other Ambulatory Visit: Payer: Self-pay

## 2024-06-22 ENCOUNTER — Ambulatory Visit: Payer: Self-pay | Admitting: Primary Care

## 2024-06-22 ENCOUNTER — Encounter: Payer: Self-pay | Admitting: Primary Care

## 2024-06-22 VITALS — BP 123/76 | HR 57 | Temp 97.5°F | Resp 16 | Ht 73.0 in | Wt 185.6 lb

## 2024-06-22 DIAGNOSIS — I10 Essential (primary) hypertension: Secondary | ICD-10-CM

## 2024-06-22 DIAGNOSIS — L299 Pruritus, unspecified: Secondary | ICD-10-CM

## 2024-06-22 DIAGNOSIS — E119 Type 2 diabetes mellitus without complications: Secondary | ICD-10-CM

## 2024-06-22 DIAGNOSIS — Z760 Encounter for issue of repeat prescription: Secondary | ICD-10-CM

## 2024-06-22 DIAGNOSIS — Z23 Encounter for immunization: Secondary | ICD-10-CM

## 2024-06-22 DIAGNOSIS — F5221 Male erectile disorder: Secondary | ICD-10-CM

## 2024-06-22 DIAGNOSIS — E785 Hyperlipidemia, unspecified: Secondary | ICD-10-CM

## 2024-06-22 MED ORDER — HYDROXYZINE HCL 10 MG PO TABS
10.0000 mg | ORAL_TABLET | Freq: Three times a day (TID) | ORAL | 2 refills | Status: AC | PRN
  Filled 2024-06-22 – 2024-06-30 (×2): qty 90, 30d supply, fill #0

## 2024-06-22 MED ORDER — METFORMIN HCL 1000 MG PO TABS
1000.0000 mg | ORAL_TABLET | Freq: Two times a day (BID) | ORAL | 0 refills | Status: AC
  Filled 2024-06-22 – 2024-06-30 (×2): qty 180, 90d supply, fill #0

## 2024-06-22 MED ORDER — SILDENAFIL CITRATE 100 MG PO TABS
50.0000 mg | ORAL_TABLET | Freq: Every day | ORAL | 3 refills | Status: AC | PRN
  Filled 2024-06-22: qty 5, 30d supply, fill #0
  Filled 2024-06-30: qty 5, 5d supply, fill #0

## 2024-06-22 MED ORDER — LISINOPRIL-HYDROCHLOROTHIAZIDE 20-25 MG PO TABS
1.0000 | ORAL_TABLET | Freq: Every day | ORAL | 1 refills | Status: AC
  Filled 2024-06-22 – 2024-06-30 (×2): qty 90, 90d supply, fill #0

## 2024-06-22 NOTE — Progress Notes (Signed)
 "  Subjective:  Patient ID: Frank Landry, male    DOB: May 28, 1961  Age: 64 y.o. MRN: [id number]  CC: Diabetes, Medication Management (Pt is wanting to know if his ED medication can be changed to cialis ), and Hypertension   Frank Landry presents for Follow-up of diabetes. Patient does check blood sugar at home.   Patient brought in [employer] advanced Testerone questioning use and was it appropriate to use. Check testosterone level . Reviewed literature and shared with patient   may improve sexual health, energy, and libido, potentially benefiting mild erectile dysfunction (ED) in men with low T, but they aren't a guaranteed fix; actual effectiveness for ED varies, especially for moderate-to-severe cases, and it's crucial to consult a doctor to determine if low testosterone is the cause of your ED and if these supplements are appropriate.  How it Works Software Engineer): Boost Testosterone: Ingredients like Testofen (fenugreek extract) are included to help maintain healthy testosterone levels, which naturally decline with age. Improve Libido & Energy: Higher testosterone levels are linked to increased sex drive, better mood, and more energy, which can improve sexual function. Support Circulation: Some formulas contain ingredients like resVida resveratrol for circulatory support, which is vital for erections.   Compliant with meds - Yes Checking CBGs? Yes  Fasting avg - 160-180  Postprandial average -  Exercising regularly? - Yes Watching carbohydrate intake? - Yes Neuropathy ? - intermittent Hypoglycemic events - No  - Recovers with :   Pertinent ROS:  Polyuria - No Polydipsia - No Vision problems - No  Hypertension- Patient has No headache, No chest pain, No abdominal pain - No Nausea, No new weakness tingling or numbness, No Cough - shortness of breath/s Medication refills  Medications as noted below. Taking them regularly without complication/adverse reaction being  reported today.   History Frank Landry has a past medical history of Diabetes mellitus without complication (HCC) and No pertinent past medical history.   He has a past surgical history that includes No past surgeries and Hernia repair.   His family history includes Cancer in his father; Diabetes in his mother.He reports that he has quit smoking. His smoking use included cigarettes. He has a 9.5 pack-year smoking history. He has never used smokeless tobacco. He reports that he does not drink alcohol and does not use drugs.  Medications Ordered Prior to Encounter[1]  Review of Systems Comprehensive ROS Pertinent positive and negative noted in HPI   Objective:  BP 123/76   Pulse (!) 57   Temp (!) 97.5 F (36.4 C)   Resp 16   Ht 6' 1 (1.854 m)   Wt 185 lb 9.6 oz (84.2 kg)   SpO2 96%   BMI 24.49 kg/m   BP Readings from Last 3 Encounters:  06/22/24 123/76  01/26/24 (!) 166/92  12/14/23 (!) 90/50    Wt Readings from Last 3 Encounters:  06/22/24 185 lb 9.6 oz (84.2 kg)  01/26/24 166 lb (75.3 kg)  12/14/23 166 lb (75.3 kg)    Physical Exam Vitals reviewed.  Constitutional:      Appearance: He is obese.  HENT:     Head: Normocephalic.     Right Ear: Tympanic membrane, ear canal and external ear normal.     Left Ear: External ear normal. There is impacted cerumen.     Nose: Nose normal.  Eyes:     Extraocular Movements: Extraocular movements intact.     Pupils: Pupils are equal, round, and reactive to light.  Cardiovascular:  Rate and Rhythm: Normal rate and regular rhythm.  Pulmonary:     Effort: Pulmonary effort is normal.     Breath sounds: Normal breath sounds.  Abdominal:     General: Bowel sounds are normal. There is distension.     Palpations: Abdomen is soft.  Musculoskeletal:        General: Normal range of motion.  Skin:    General: Skin is warm and dry.  Neurological:     Mental Status: He is oriented to person, place, and time.  Psychiatric:         Mood and Affect: Mood normal.        Behavior: Behavior normal.        Thought Content: Thought content normal.        Judgment: Judgment normal.     Lab Results  Component Value Date   HGBA1C 6.1 (H) 10/21/2023   HGBA1C 6.1 04/22/2023   HGBA1C 6.4 (H) 01/21/2023    Lab Results  Component Value Date   WBC 5.5 10/21/2023   HGB 12.6 (L) 10/21/2023   HCT 40.4 10/21/2023   PLT 248 10/21/2023   GLUCOSE 88 10/21/2023   CHOL 166 10/21/2023   TRIG 53 10/21/2023   HDL 74 10/21/2023   LDLCALC 81 10/21/2023   ALT 13 10/21/2023   AST 20 10/21/2023   NA 141 10/21/2023   K 3.7 10/21/2023   CL 102 10/21/2023   CREATININE 0.80 10/21/2023   BUN 10 10/21/2023   CO2 24 10/21/2023   TSH 2.310 04/13/2017   HGBA1C 6.1 (H) 10/21/2023    Title   Diabetic Foot Exam - detailed    Semmes-Weinstein Monofilament Test + means has sensation and - means no sensation      Image components are not supported.   Image components are not supported. Image components are not supported.  [place] Comments      Assessment & Plan:   Frank Landry was seen today for diabetes, medication management and hypertension.  Diagnoses and all orders for this visit:  Type 2 diabetes mellitus without complication, without long-term current use of insulin  (HCC) -     metFORMIN  (GLUCOPHAGE ) 1000 MG tablet; Take 1 tablet (1,000 mg total) by mouth 2 (two) times daily with a meal. -     CBC with Differential/Platelet -     Hemoglobin A1c  Medication refill -     hydrOXYzine  (ATARAX ) 10 MG tablet; Take 1 tablet (10 mg total) by mouth every 8 (eight) hours as needed.  Pruritus -     hydrOXYzine  (ATARAX ) 10 MG tablet; Take 1 tablet (10 mg total) by mouth every 8 (eight) hours as needed.  Essential hypertension -     lisinopril -hydrochlorothiazide  (ZESTORETIC ) 20-25 MG tablet; Take 1 tablet by mouth daily. -     CMP14+EGFR  Hyperlipidemia, unspecified hyperlipidemia type -     Lipid  panel  Encounter for immunization -     Flu vaccine HIGH DOSE PF(Fluzone Trivalent)  Erectile disorder, acquired, generalized, moderate -     Testosterone Total,Free,Bio, Males -     sildenafil  (VIAGRA ) 100 MG tablet; Take 0.5-1 tablets (50-100 mg total) by mouth daily as needed for erectile dysfunction.      Follow-up:  Return in about 3 months (around 09/20/2024) for fasting labs.  The above assessment and management plan was discussed with the patient. The patient verbalized understanding of and has agreed to the management plan. Patient is aware to call the clinic if symptoms fail  to improve or worsen. Patient is aware when to return to the clinic for a follow-up visit. Patient educated on when it is appropriate to go to the emergency department.   Rosaline Bohr, NP-C       [1]  Current Outpatient Medications on File Prior to Visit  Medication Sig Dispense Refill   atorvastatin  (LIPITOR) 10 MG tablet Take 1 tablet (10 mg total) by mouth daily. 90 tablet 3   Colloidal Oatmeal (EUCERIN ECZEMA RELIEF) 1 % CREA Apply 156 g topically 2 (two) times daily as needed. 156 g 2   glucose monitoring kit (FREESTYLE) monitoring kit 1 each by Does not apply route as needed for other. Please include lancet and test strip . Testing once daily 1 each 0   No current facility-administered medications on file prior to visit.   "

## 2024-06-22 NOTE — Patient Instructions (Signed)

## 2024-06-24 LAB — HEMOGLOBIN A1C
Est. average glucose Bld gHb Est-mCnc: 123 mg/dL
Hgb A1c MFr Bld: 5.9 % — ABNORMAL HIGH (ref 4.8–5.6)

## 2024-06-24 LAB — LIPID PANEL
Chol/HDL Ratio: 2.4 ratio (ref 0.0–5.0)
Cholesterol, Total: 182 mg/dL (ref 100–199)
HDL: 76 mg/dL
LDL Chol Calc (NIH): 98 mg/dL (ref 0–99)
Triglycerides: 37 mg/dL (ref 0–149)
VLDL Cholesterol Cal: 8 mg/dL (ref 5–40)

## 2024-06-24 LAB — CBC WITH DIFFERENTIAL/PLATELET
Basophils Absolute: 0 x10E3/uL (ref 0.0–0.2)
Basos: 1 %
EOS (ABSOLUTE): 0.2 x10E3/uL (ref 0.0–0.4)
Eos: 3 %
Hematocrit: 36.3 % — ABNORMAL LOW (ref 37.5–51.0)
Hemoglobin: 12.3 g/dL — ABNORMAL LOW (ref 13.0–17.7)
Immature Grans (Abs): 0 x10E3/uL (ref 0.0–0.1)
Immature Granulocytes: 0 %
Lymphocytes Absolute: 2.2 x10E3/uL (ref 0.7–3.1)
Lymphs: 39 %
MCH: 33.4 pg — ABNORMAL HIGH (ref 26.6–33.0)
MCHC: 33.9 g/dL (ref 31.5–35.7)
MCV: 99 fL — ABNORMAL HIGH (ref 79–97)
Monocytes Absolute: 0.4 x10E3/uL (ref 0.1–0.9)
Monocytes: 7 %
Neutrophils Absolute: 2.8 x10E3/uL (ref 1.4–7.0)
Neutrophils: 50 %
Platelets: 240 x10E3/uL (ref 150–450)
RBC: 3.68 x10E6/uL — ABNORMAL LOW (ref 4.14–5.80)
RDW: 12.6 % (ref 11.6–15.4)
WBC: 5.7 x10E3/uL (ref 3.4–10.8)

## 2024-06-24 LAB — TESTOSTERONE,FREE AND TOTAL
Testosterone, Free: 5.4 pg/mL — ABNORMAL LOW (ref 6.6–18.1)
Testosterone: 734 ng/dL (ref 264–916)

## 2024-06-24 LAB — CMP14+EGFR
ALT: 12 IU/L (ref 0–44)
AST: 19 IU/L (ref 0–40)
Albumin: 4.4 g/dL (ref 3.9–4.9)
Alkaline Phosphatase: 79 IU/L (ref 47–123)
BUN/Creatinine Ratio: 16 (ref 10–24)
BUN: 16 mg/dL (ref 8–27)
Bilirubin Total: 0.4 mg/dL (ref 0.0–1.2)
CO2: 22 mmol/L (ref 20–29)
Calcium: 9.8 mg/dL (ref 8.6–10.2)
Chloride: 105 mmol/L (ref 96–106)
Creatinine, Ser: 1.02 mg/dL (ref 0.76–1.27)
Globulin, Total: 2.6 g/dL (ref 1.5–4.5)
Glucose: 83 mg/dL (ref 70–99)
Potassium: 4.4 mmol/L (ref 3.5–5.2)
Sodium: 141 mmol/L (ref 134–144)
Total Protein: 7 g/dL (ref 6.0–8.5)
eGFR: 82 mL/min/1.73

## 2024-06-30 ENCOUNTER — Other Ambulatory Visit: Payer: Self-pay

## 2024-08-17 ENCOUNTER — Ambulatory Visit: Admitting: Podiatry

## 2024-09-14 ENCOUNTER — Ambulatory Visit (INDEPENDENT_AMBULATORY_CARE_PROVIDER_SITE_OTHER): Payer: Self-pay | Admitting: Primary Care
